# Patient Record
Sex: Female | Born: 1945 | Race: White | Hispanic: No | Marital: Married | Smoking: Never smoker
Health system: Southern US, Community
[De-identification: ages and names within clinical notes are randomized; demographics above are authoritative.]

## PROBLEM LIST (undated history)

## (undated) DIAGNOSIS — J329 Chronic sinusitis, unspecified: Secondary | ICD-10-CM

## (undated) DIAGNOSIS — K219 Gastro-esophageal reflux disease without esophagitis: Secondary | ICD-10-CM

## (undated) DIAGNOSIS — I10 Essential (primary) hypertension: Secondary | ICD-10-CM

## (undated) DIAGNOSIS — Z8619 Personal history of other infectious and parasitic diseases: Secondary | ICD-10-CM

## (undated) DIAGNOSIS — C8590 Non-Hodgkin lymphoma, unspecified, unspecified site: Secondary | ICD-10-CM

## (undated) HISTORY — DX: Personal history of other infectious and parasitic diseases: Z86.19

## (undated) HISTORY — PX: PORTACATH PLACEMENT: SHX2246

## (undated) HISTORY — PX: ESOPHAGOGASTRODUODENOSCOPY: SHX1529

## (undated) HISTORY — PX: COLONOSCOPY: SHX174

## (undated) HISTORY — DX: Chronic sinusitis, unspecified: J32.9

## (undated) HISTORY — PX: OTHER SURGICAL HISTORY: SHX169

## (undated) HISTORY — DX: Essential (primary) hypertension: I10

## (undated) HISTORY — PX: CHOLECYSTECTOMY: SHX55

## (undated) HISTORY — PX: REFRACTIVE SURGERY: SHX103

## (undated) HISTORY — DX: Non-Hodgkin lymphoma, unspecified, unspecified site: C85.90

## (undated) HISTORY — PX: PORT-A-CATH REMOVAL: SHX5289

## (undated) HISTORY — PX: TONSILLECTOMY: SUR1361

## (undated) HISTORY — DX: Gastro-esophageal reflux disease without esophagitis: K21.9

---

## 2002-06-09 ENCOUNTER — Encounter (HOSPITAL_COMMUNITY): Admission: RE | Admit: 2002-06-09 | Discharge: 2002-07-09 | Payer: Self-pay | Admitting: Oncology

## 2002-06-09 ENCOUNTER — Encounter: Admission: RE | Admit: 2002-06-09 | Discharge: 2002-06-09 | Payer: Self-pay | Admitting: Oncology

## 2002-06-24 ENCOUNTER — Encounter (HOSPITAL_COMMUNITY): Payer: Self-pay | Admitting: Oncology

## 2002-07-03 ENCOUNTER — Encounter: Payer: Self-pay | Admitting: General Surgery

## 2002-07-03 ENCOUNTER — Ambulatory Visit (HOSPITAL_COMMUNITY): Admission: RE | Admit: 2002-07-03 | Discharge: 2002-07-03 | Payer: Self-pay | Admitting: General Surgery

## 2002-07-21 ENCOUNTER — Encounter (HOSPITAL_COMMUNITY): Admission: RE | Admit: 2002-07-21 | Discharge: 2002-08-20 | Payer: Self-pay | Admitting: Oncology

## 2002-07-21 ENCOUNTER — Encounter: Admission: RE | Admit: 2002-07-21 | Discharge: 2002-07-21 | Payer: Self-pay | Admitting: Oncology

## 2002-08-28 ENCOUNTER — Encounter (HOSPITAL_COMMUNITY): Admission: RE | Admit: 2002-08-28 | Discharge: 2002-09-27 | Payer: Self-pay | Admitting: Oncology

## 2002-08-28 ENCOUNTER — Encounter: Admission: RE | Admit: 2002-08-28 | Discharge: 2002-08-28 | Payer: Self-pay | Admitting: Oncology

## 2002-09-29 ENCOUNTER — Encounter (HOSPITAL_COMMUNITY): Admission: RE | Admit: 2002-09-29 | Discharge: 2002-10-29 | Payer: Self-pay | Admitting: Oncology

## 2002-09-29 ENCOUNTER — Encounter: Admission: RE | Admit: 2002-09-29 | Discharge: 2002-09-29 | Payer: Self-pay | Admitting: Oncology

## 2002-10-29 ENCOUNTER — Encounter (HOSPITAL_COMMUNITY): Payer: Self-pay | Admitting: Oncology

## 2002-11-11 ENCOUNTER — Encounter: Admission: RE | Admit: 2002-11-11 | Discharge: 2002-11-11 | Payer: Self-pay | Admitting: Oncology

## 2002-11-11 ENCOUNTER — Encounter (HOSPITAL_COMMUNITY): Admission: RE | Admit: 2002-11-11 | Discharge: 2002-12-11 | Payer: Self-pay | Admitting: Oncology

## 2002-11-12 ENCOUNTER — Encounter: Admission: RE | Admit: 2002-11-12 | Discharge: 2002-11-12 | Payer: Self-pay | Admitting: Dentistry

## 2002-11-16 ENCOUNTER — Ambulatory Visit: Admission: RE | Admit: 2002-11-16 | Discharge: 2003-02-14 | Payer: Self-pay | Admitting: *Deleted

## 2002-11-19 ENCOUNTER — Ambulatory Visit (HOSPITAL_COMMUNITY): Admission: RE | Admit: 2002-11-19 | Discharge: 2002-11-19 | Payer: Self-pay | Admitting: General Surgery

## 2002-12-16 ENCOUNTER — Encounter (HOSPITAL_COMMUNITY): Admission: RE | Admit: 2002-12-16 | Discharge: 2003-01-15 | Payer: Self-pay | Admitting: Oncology

## 2002-12-16 ENCOUNTER — Encounter: Admission: RE | Admit: 2002-12-16 | Discharge: 2002-12-16 | Payer: Self-pay | Admitting: Oncology

## 2003-02-19 ENCOUNTER — Encounter: Admission: RE | Admit: 2003-02-19 | Discharge: 2003-02-19 | Payer: Self-pay | Admitting: Oncology

## 2003-02-19 ENCOUNTER — Encounter (HOSPITAL_COMMUNITY): Admission: RE | Admit: 2003-02-19 | Discharge: 2003-03-21 | Payer: Self-pay | Admitting: Oncology

## 2003-04-09 ENCOUNTER — Encounter: Admission: RE | Admit: 2003-04-09 | Discharge: 2003-04-09 | Payer: Self-pay | Admitting: Oncology

## 2003-08-20 ENCOUNTER — Encounter: Admission: RE | Admit: 2003-08-20 | Discharge: 2003-08-20 | Payer: Self-pay | Admitting: Oncology

## 2003-08-20 ENCOUNTER — Encounter (HOSPITAL_COMMUNITY): Admission: RE | Admit: 2003-08-20 | Discharge: 2003-09-19 | Payer: Self-pay | Admitting: Oncology

## 2003-10-25 ENCOUNTER — Ambulatory Visit (HOSPITAL_COMMUNITY): Admission: RE | Admit: 2003-10-25 | Discharge: 2003-10-25 | Payer: Self-pay | Admitting: Oncology

## 2004-03-07 ENCOUNTER — Encounter (HOSPITAL_COMMUNITY): Admission: RE | Admit: 2004-03-07 | Discharge: 2004-03-17 | Payer: Self-pay | Admitting: Oncology

## 2004-03-07 ENCOUNTER — Encounter: Admission: RE | Admit: 2004-03-07 | Discharge: 2004-03-17 | Payer: Self-pay | Admitting: Oncology

## 2004-09-04 ENCOUNTER — Encounter: Admission: RE | Admit: 2004-09-04 | Discharge: 2004-09-04 | Payer: Self-pay | Admitting: Oncology

## 2004-09-04 ENCOUNTER — Ambulatory Visit (HOSPITAL_COMMUNITY): Payer: Self-pay | Admitting: Oncology

## 2004-09-04 ENCOUNTER — Encounter (HOSPITAL_COMMUNITY): Admission: RE | Admit: 2004-09-04 | Discharge: 2004-10-04 | Payer: Self-pay | Admitting: Oncology

## 2004-10-30 ENCOUNTER — Ambulatory Visit (HOSPITAL_COMMUNITY): Admission: RE | Admit: 2004-10-30 | Discharge: 2004-10-30 | Payer: Self-pay | Admitting: Oncology

## 2005-03-05 ENCOUNTER — Encounter: Admission: RE | Admit: 2005-03-05 | Discharge: 2005-03-17 | Payer: Self-pay | Admitting: Oncology

## 2005-03-05 ENCOUNTER — Encounter (HOSPITAL_COMMUNITY): Admission: RE | Admit: 2005-03-05 | Discharge: 2005-03-17 | Payer: Self-pay | Admitting: Oncology

## 2005-03-05 ENCOUNTER — Ambulatory Visit (HOSPITAL_COMMUNITY): Payer: Self-pay | Admitting: Oncology

## 2005-09-03 ENCOUNTER — Encounter (HOSPITAL_COMMUNITY): Admission: RE | Admit: 2005-09-03 | Discharge: 2005-10-03 | Payer: Self-pay | Admitting: Oncology

## 2005-09-03 ENCOUNTER — Encounter: Admission: RE | Admit: 2005-09-03 | Discharge: 2005-09-03 | Payer: Self-pay | Admitting: Oncology

## 2005-09-03 ENCOUNTER — Ambulatory Visit (HOSPITAL_COMMUNITY): Payer: Self-pay | Admitting: Oncology

## 2005-11-05 ENCOUNTER — Ambulatory Visit (HOSPITAL_COMMUNITY): Admission: RE | Admit: 2005-11-05 | Discharge: 2005-11-05 | Payer: Self-pay | Admitting: Oncology

## 2006-03-04 ENCOUNTER — Encounter (HOSPITAL_COMMUNITY): Admission: RE | Admit: 2006-03-04 | Discharge: 2006-03-15 | Payer: Self-pay | Admitting: Oncology

## 2006-03-04 ENCOUNTER — Ambulatory Visit (HOSPITAL_COMMUNITY): Payer: Self-pay | Admitting: Oncology

## 2006-03-04 ENCOUNTER — Encounter: Admission: RE | Admit: 2006-03-04 | Discharge: 2006-03-15 | Payer: Self-pay | Admitting: Oncology

## 2006-09-17 ENCOUNTER — Encounter (HOSPITAL_COMMUNITY): Admission: RE | Admit: 2006-09-17 | Discharge: 2006-10-17 | Payer: Self-pay | Admitting: Oncology

## 2006-09-17 ENCOUNTER — Ambulatory Visit (HOSPITAL_COMMUNITY): Payer: Self-pay | Admitting: Oncology

## 2006-11-05 ENCOUNTER — Ambulatory Visit (HOSPITAL_COMMUNITY): Admission: RE | Admit: 2006-11-05 | Discharge: 2006-11-05 | Payer: Self-pay | Admitting: Oncology

## 2007-04-01 ENCOUNTER — Ambulatory Visit (HOSPITAL_COMMUNITY): Payer: Self-pay | Admitting: Oncology

## 2007-04-01 ENCOUNTER — Encounter (HOSPITAL_COMMUNITY): Admission: RE | Admit: 2007-04-01 | Discharge: 2007-05-01 | Payer: Self-pay | Admitting: Oncology

## 2007-05-06 ENCOUNTER — Ambulatory Visit (HOSPITAL_COMMUNITY): Admission: RE | Admit: 2007-05-06 | Discharge: 2007-05-06 | Payer: Self-pay | Admitting: Otolaryngology

## 2007-07-11 ENCOUNTER — Ambulatory Visit (HOSPITAL_COMMUNITY): Admission: RE | Admit: 2007-07-11 | Discharge: 2007-07-11 | Payer: Self-pay | Admitting: Otolaryngology

## 2007-10-07 ENCOUNTER — Encounter (HOSPITAL_COMMUNITY): Admission: RE | Admit: 2007-10-07 | Discharge: 2007-11-06 | Payer: Self-pay | Admitting: Oncology

## 2007-10-07 ENCOUNTER — Ambulatory Visit (HOSPITAL_COMMUNITY): Payer: Self-pay | Admitting: Oncology

## 2007-11-11 ENCOUNTER — Ambulatory Visit (HOSPITAL_COMMUNITY): Admission: RE | Admit: 2007-11-11 | Discharge: 2007-11-11 | Payer: Self-pay | Admitting: Oncology

## 2008-04-05 ENCOUNTER — Ambulatory Visit (HOSPITAL_COMMUNITY): Payer: Self-pay | Admitting: Oncology

## 2008-04-05 ENCOUNTER — Encounter (HOSPITAL_COMMUNITY): Admission: RE | Admit: 2008-04-05 | Discharge: 2008-05-05 | Payer: Self-pay | Admitting: Oncology

## 2008-10-04 ENCOUNTER — Ambulatory Visit (HOSPITAL_COMMUNITY): Payer: Self-pay | Admitting: Oncology

## 2008-10-04 ENCOUNTER — Encounter (HOSPITAL_COMMUNITY): Admission: RE | Admit: 2008-10-04 | Discharge: 2008-11-03 | Payer: Self-pay | Admitting: Oncology

## 2008-11-16 ENCOUNTER — Ambulatory Visit (HOSPITAL_COMMUNITY): Admission: RE | Admit: 2008-11-16 | Discharge: 2008-11-16 | Payer: Self-pay | Admitting: Oncology

## 2009-04-04 ENCOUNTER — Ambulatory Visit (HOSPITAL_COMMUNITY): Payer: Self-pay | Admitting: Oncology

## 2009-04-04 ENCOUNTER — Encounter (HOSPITAL_COMMUNITY): Admission: RE | Admit: 2009-04-04 | Discharge: 2009-05-04 | Payer: Self-pay | Admitting: Oncology

## 2009-11-17 ENCOUNTER — Ambulatory Visit (HOSPITAL_COMMUNITY): Admission: RE | Admit: 2009-11-17 | Discharge: 2009-11-17 | Payer: Self-pay | Admitting: Oncology

## 2009-11-21 ENCOUNTER — Encounter (HOSPITAL_COMMUNITY): Admission: RE | Admit: 2009-11-21 | Discharge: 2009-12-21 | Payer: Self-pay | Admitting: Oncology

## 2010-05-30 ENCOUNTER — Encounter (HOSPITAL_COMMUNITY)
Admission: RE | Admit: 2010-05-30 | Discharge: 2010-06-29 | Payer: Self-pay | Source: Home / Self Care | Attending: Oncology | Admitting: Oncology

## 2010-05-30 ENCOUNTER — Ambulatory Visit (HOSPITAL_COMMUNITY): Payer: Self-pay | Admitting: Oncology

## 2010-07-10 ENCOUNTER — Encounter (HOSPITAL_COMMUNITY): Payer: Self-pay | Admitting: Oncology

## 2010-08-28 LAB — DIFFERENTIAL
Basophils Absolute: 0 10*3/uL (ref 0.0–0.1)
Lymphocytes Relative: 26 % (ref 12–46)
Lymphs Abs: 1.2 10*3/uL (ref 0.7–4.0)
Monocytes Relative: 8 % (ref 3–12)
Neutrophils Relative %: 64 % (ref 43–77)

## 2010-08-28 LAB — COMPREHENSIVE METABOLIC PANEL
AST: 22 U/L (ref 0–37)
Alkaline Phosphatase: 74 U/L (ref 39–117)
CO2: 32 mEq/L (ref 19–32)
Calcium: 10 mg/dL (ref 8.4–10.5)
Chloride: 95 mEq/L — ABNORMAL LOW (ref 96–112)
GFR calc Af Amer: >60 mL/min (ref >60)
GFR calc non Af Amer: >60 mL/min (ref >60)
Glucose, Bld: 113 mg/dL — ABNORMAL HIGH (ref 70–99)
Sodium: 134 mEq/L — ABNORMAL LOW (ref 135–145)

## 2010-08-28 LAB — CBC
Hemoglobin: 12.7 g/dL (ref 12.0–15.0)
RBC: 4.01 MIL/uL (ref 3.87–5.11)
WBC: 4.5 10*3/uL (ref 4.0–10.5)

## 2010-09-04 LAB — DIFFERENTIAL
Eosinophils Absolute: 0.1 10*3/uL (ref 0.0–0.7)
Lymphs Abs: 1.2 10*3/uL (ref 0.7–4.0)
Monocytes Absolute: 0.4 10*3/uL (ref 0.1–1.0)
Monocytes Relative: 9 % (ref 3–12)
Neutro Abs: 2.7 10*3/uL (ref 1.7–7.7)

## 2010-09-04 LAB — GLUCOSE, CAPILLARY: Glucose-Capillary: 172 mg/dL — ABNORMAL HIGH (ref 70–99)

## 2010-09-04 LAB — CBC
HCT: 35.6 % — ABNORMAL LOW (ref 36.0–46.0)
Hemoglobin: 12.6 g/dL (ref 12.0–15.0)
MCHC: 35.6 g/dL (ref 30.0–36.0)
MCV: 91.9 fL (ref 78.0–100.0)
RBC: 3.87 MIL/uL (ref 3.87–5.11)

## 2010-09-04 LAB — COMPREHENSIVE METABOLIC PANEL
ALT: 21 U/L (ref 0–35)
AST: 21 U/L (ref 0–37)
Albumin: 4.3 g/dL (ref 3.5–5.2)
Alkaline Phosphatase: 79 U/L (ref 39–117)
BUN: 16 mg/dL (ref 6–23)
CO2: 32 mEq/L (ref 19–32)
Calcium: 10 mg/dL (ref 8.4–10.5)
Chloride: 95 mEq/L — ABNORMAL LOW (ref 96–112)
Creatinine, Ser: 0.77 mg/dL (ref 0.4–1.2)
GFR calc Af Amer: >60 mL/min (ref >60)
GFR calc non Af Amer: >60 mL/min (ref >60)
Glucose, Bld: 124 mg/dL — ABNORMAL HIGH (ref 70–99)
Potassium: 3.9 mEq/L (ref 3.5–5.1)
Sodium: 136 mEq/L (ref 135–145)
Total Bilirubin: 0.7 mg/dL (ref 0.3–1.2)
Total Protein: 6.5 g/dL (ref 6.0–8.3)

## 2010-09-04 LAB — LACTATE DEHYDROGENASE: LDH: 146 U/L (ref 94–250)

## 2010-09-21 LAB — COMPREHENSIVE METABOLIC PANEL
ALT: 21 U/L (ref 0–35)
AST: 19 U/L (ref 0–37)
Alkaline Phosphatase: 65 U/L (ref 39–117)
BUN: 16 mg/dL (ref 6–23)
Calcium: 9.5 mg/dL (ref 8.4–10.5)
Creatinine, Ser: 0.77 mg/dL (ref 0.4–1.2)
GFR calc non Af Amer: >60 mL/min (ref >60)
Potassium: 3.8 mEq/L (ref 3.5–5.1)
Total Protein: 6.5 g/dL (ref 6.0–8.3)

## 2010-09-21 LAB — CBC
HCT: 35.8 % — ABNORMAL LOW (ref 36.0–46.0)
Hemoglobin: 12.6 g/dL (ref 12.0–15.0)
MCHC: 35.2 g/dL (ref 30.0–36.0)
Platelets: 147 10*3/uL — ABNORMAL LOW (ref 150–400)
RDW: 12.6 % (ref 11.5–15.5)
WBC: 3.5 10*3/uL — ABNORMAL LOW (ref 4.0–10.5)

## 2010-09-21 LAB — DIFFERENTIAL
Basophils Absolute: 0 10*3/uL (ref 0.0–0.1)
Eosinophils Relative: 2 % (ref 0–5)
Monocytes Absolute: 0.3 10*3/uL (ref 0.1–1.0)

## 2010-09-21 LAB — SEDIMENTATION RATE: Sed Rate: 5 mm/hr (ref 0–22)

## 2010-09-25 LAB — GLUCOSE, CAPILLARY: Glucose-Capillary: 170 mg/dL — ABNORMAL HIGH (ref 70–99)

## 2010-09-27 LAB — COMPREHENSIVE METABOLIC PANEL
ALT: 18 U/L (ref 0–35)
AST: 20 U/L (ref 0–37)
Albumin: 4 g/dL (ref 3.5–5.2)
Alkaline Phosphatase: 84 U/L (ref 39–117)
BUN: 16 mg/dL (ref 6–23)
CO2: 32 mEq/L (ref 19–32)
Calcium: 9.7 mg/dL (ref 8.4–10.5)
Chloride: 96 mEq/L (ref 96–112)
Creatinine, Ser: 0.8 mg/dL (ref 0.4–1.2)
GFR calc Af Amer: >60 mL/min (ref >60)
GFR calc non Af Amer: >60 mL/min (ref >60)
Glucose, Bld: 113 mg/dL — ABNORMAL HIGH (ref 70–99)
Potassium: 4.5 mEq/L (ref 3.5–5.1)
Sodium: 136 mEq/L (ref 135–145)
Total Bilirubin: 0.6 mg/dL (ref 0.3–1.2)
Total Protein: 6.5 g/dL (ref 6.0–8.3)

## 2010-09-27 LAB — DIFFERENTIAL
Eosinophils Absolute: 0.1 10*3/uL (ref 0.0–0.7)
Eosinophils Relative: 2 % (ref 0–5)
Lymphocytes Relative: 23 % (ref 12–46)
Lymphs Abs: 0.9 10*3/uL (ref 0.7–4.0)
Monocytes Absolute: 0.3 10*3/uL (ref 0.1–1.0)
Monocytes Relative: 6 % (ref 3–12)

## 2010-09-27 LAB — CBC
MCHC: 36 g/dL (ref 30.0–36.0)
Platelets: 162 10*3/uL (ref 150–400)
RBC: 3.91 MIL/uL (ref 3.87–5.11)
WBC: 4.1 10*3/uL (ref 4.0–10.5)

## 2010-11-03 NOTE — H&P (Signed)
   [name] [name], [name] [name]                           ACCOUNT NO.:  [id number]   MEDICAL RECORD NO.:  [id number]                   PATIENT TYPE:  AMB   LOCATION:  DAY                                  FACILITY:  [facility]   PHYSICIAN:  Dalia Heading, M.D.               DATE OF BIRTH:  1946/06/14   DATE OF ADMISSION:  07/03/2002  DATE OF DISCHARGE:  07/03/2002                                HISTORY & PHYSICAL   CHIEF COMPLAINT:  Lymphoma, finished chemotherapy.   HISTORY OF PRESENT ILLNESS:  The patient is a 65 year old white female who  was referred for removal of a Port-A-Cath.  She was treated for lymphoma and  has finished her chemotherapy.   PAST MEDICAL HISTORY:  1. Hypertension.  2. Non-insulin-dependent diabetes mellitus.   PAST SURGICAL HISTORY:  1. Cholecystectomy.  2. Tonsillectomy.  3. Neck lymph node biopsy in 2003.  4. Port-A-Cath insertion in January 2004.   CURRENT MEDICATIONS:  1. Amaryl.  2. Hydrochlorothiazide.  3. Claritin as needed.   ALLERGIES:  1. SULFA.  2. CODEINE.   REVIEW OF SYSTEMS:  Noncontributory.   PHYSICAL EXAMINATION:  GENERAL:  Patient is a well-developed, well-nourished  white female in no acute distress.  VITAL SIGNS:  She is afebrile and vital signs are stable.  LUNGS:  Clear to auscultation with equal breath sounds bilaterally.  HEART:  Reveals a regular, rate and rhythm without S3, S4, murmurs.  A Port-  A-Cath is in place in the left upper chest.   IMPRESSION:  Lymphoma, finished with chemotherapy.    PLAN:  The patient was scheduled for Port-A-Cath removal on November 19, 2002.  The risks and benefits of the procedure including bleeding and infection  were fully explained to the patient who gave informed consent.                                                 Dalia Heading, M.D.    MAJ/MEDQ  D:  11/17/2002  T:  11/17/2002  Job:  [id number]   cc:   Ladona Horns. Neijstrom, MD  [address]  [place]  [place] [address]  Fax:  [phone]

## 2010-11-03 NOTE — Op Note (Signed)
   NAME:  [name] [name], [name] [name]                           ACCOUNT NO.:  [id number]   MEDICAL RECORD NO.:  [id number]                   PATIENT TYPE:  AMB   LOCATION:  DAY                                  FACILITY:  [facility]   PHYSICIAN:  Dalia Heading, M.D.               DATE OF BIRTH:  [birth year]   DATE OF PROCEDURE:  11/19/2002  DATE OF DISCHARGE:                                 OPERATIVE REPORT   PREOPERATIVE DIAGNOSIS:  Lymphoma, finished with chemotherapy.   POSTOPERATIVE DIAGNOSIS:  Lymphoma, finished with chemotherapy.   PROCEDURE:  Port-A-Cath removal.   SURGEON:  Dalia Heading, M.D.   ANESTHESIA:  Local.   INDICATIONS:  The patient is a 65 year old white female who presents for  removal of her Port-A-Cath.  She has finished her chemotherapy for lymphoma.  The risks and benefits of the procedure were fully explained to the patient,  who gave informed consent.   PROCEDURE NOTE:  The patient was placed in the supine position.  The left  upper chest was prepped and draped using the usual sterile technique with  Betadine.  Surgical site confirmation was performed.  Xylocaine 1% was used  for local anesthesia.   A transverse incision was made through the previous surgical incision site.  This was taken down to the port and the port was removed without difficulty.  No abnormal bleeding was noted at the end of the procedure.  The  subcutaneous layer was reapproximated using a 3-0 Vicryl interrupted suture.  The skin was closed using a 4-0 Vicryl subcuticular suture.  Steri-Strips  and a dry sterile dressing were applied.   All tape and needle counts were correct at the end of the procedure.  The  patient was transferred back to day surgery in stable condition.   COMPLICATIONS:  None.    SPECIMENS:  None.   ESTIMATED BLOOD LOSS:  Minimal.                                               Dalia Heading, M.D.    MAJ/MEDQ  D:  11/19/2002  T:  11/19/2002  Job:  [id number]   cc:    Ladona Horns. Neijstrom, MD  [address]  [place]  [place] [address]  Fax: [phone]

## 2010-11-03 NOTE — Procedures (Signed)
   [name], [name]                           ACCOUNT NO.:  [id number]   MEDICAL RECORD NO.:  [id number]                   PATIENT TYPE:  AMB   LOCATION:  DAY                                  FACILITY:  [facility]   PHYSICIAN:  Vida Roller, M.D.                DATE OF BIRTH:  06-23-1945   DATE OF PROCEDURE:  08/28/2002  DATE OF DISCHARGE:  07/03/2002                                  ECHOCARDIOGRAM   REFERRING PHYSICIAN:  Dr. Mariel Sleet   Tape #[id number], tape count [id number].   HISTORY:  This is a patient with lymphoma status post chemotherapy for  assessment of LV function.   The technical quality of the study is adequate.   M-MODE MEASUREMENTS:  The aorta is 26 mm.   The left atrium is 40 mm.   The septum is 11 mm.   The posterior wall is 11 mm.   Diastolic dimension is 45 mm.   Systolic dimension is 25 mm.   2-D AND DOPPLER IMAGING:  The left ventricle is normal size with normal  systolic function.  There are no wall motion abnormalities seen.   Diastolic function was not adequately assessed.   The right ventricle is normal size with normal systolic function.   Both atria are normal size.   The aortic valve is mildly sclerotic with evidence of trace aortic  insufficiency.  No stenosis is seen.   The mitral valve is morphologically unremarkable with trivial mitral  regurgitation, no stenosis is seen.   The tricuspid valve shows trace insufficiency.  No stenosis is seen.   The pulmonic valve was not well seen.   The ascending aorta was not well seen.   The pericardial structures appear normal.                                               Vida Roller, M.D.    JH/MEDQ  D:  08/28/2002  T:  08/29/2002  Job:  [id number]

## 2010-11-14 ENCOUNTER — Ambulatory Visit (HOSPITAL_COMMUNITY): Payer: Self-pay | Admitting: Oncology

## 2010-11-15 ENCOUNTER — Other Ambulatory Visit (HOSPITAL_COMMUNITY): Payer: Self-pay | Admitting: Oncology

## 2010-11-15 ENCOUNTER — Encounter (HOSPITAL_COMMUNITY): Payer: Medicare Other | Attending: Oncology | Admitting: Oncology

## 2010-11-15 DIAGNOSIS — Z9221 Personal history of antineoplastic chemotherapy: Secondary | ICD-10-CM | POA: Insufficient documentation

## 2010-11-15 DIAGNOSIS — Z923 Personal history of irradiation: Secondary | ICD-10-CM | POA: Insufficient documentation

## 2010-11-15 DIAGNOSIS — C8589 Other specified types of non-Hodgkin lymphoma, extranodal and solid organ sites: Secondary | ICD-10-CM | POA: Insufficient documentation

## 2010-11-15 DIAGNOSIS — K589 Irritable bowel syndrome without diarrhea: Secondary | ICD-10-CM | POA: Insufficient documentation

## 2010-11-15 DIAGNOSIS — E119 Type 2 diabetes mellitus without complications: Secondary | ICD-10-CM | POA: Insufficient documentation

## 2010-11-15 LAB — COMPREHENSIVE METABOLIC PANEL
ALT: 17 U/L (ref 0–35)
Alkaline Phosphatase: 96 U/L (ref 39–117)
BUN: 23 mg/dL (ref 6–23)
CO2: 29 mEq/L (ref 19–32)
Calcium: 10.3 mg/dL (ref 8.4–10.5)
GFR calc non Af Amer: >60 mL/min (ref >60)
Glucose, Bld: 165 mg/dL — ABNORMAL HIGH (ref 70–99)
Sodium: 131 mEq/L — ABNORMAL LOW (ref 135–145)
Total Protein: 7 g/dL (ref 6.0–8.3)

## 2010-11-15 LAB — DIFFERENTIAL
Basophils Absolute: 0 10*3/uL (ref 0.0–0.1)
Eosinophils Relative: 2 % (ref 0–5)
Lymphocytes Relative: 27 % (ref 12–46)
Lymphs Abs: 1.4 10*3/uL (ref 0.7–4.0)
Monocytes Absolute: 0.3 10*3/uL (ref 0.1–1.0)
Monocytes Relative: 6 % (ref 3–12)
Neutro Abs: 3.3 10*3/uL (ref 1.7–7.7)

## 2010-11-15 LAB — SEDIMENTATION RATE: Sed Rate: 9 mm/hr (ref 0–22)

## 2010-11-15 LAB — CBC
HCT: 35 % — ABNORMAL LOW (ref 36.0–46.0)
Hemoglobin: 12.3 g/dL (ref 12.0–15.0)
MCHC: 35.1 g/dL (ref 30.0–36.0)
MCV: 89.3 fL (ref 78.0–100.0)
RDW: 11.9 % (ref 11.5–15.5)

## 2010-11-15 LAB — LACTATE DEHYDROGENASE: LDH: 213 U/L (ref 94–250)

## 2011-02-27 IMAGING — PT NM PET TUM IMG RESTAG (PS) SKULL BASE T - THIGH
1 of 6 series · 1 of 25 positions shown · non-contrast
Comparison: 11/16/2008

CLINICAL DATA: Subsequent treatment strategy for lymphoma.
Chemotherapy last treatment 6445.

NUCLEAR MEDICINE PET CT SKULL BASE TO THIGH
TECHNIQUE: 18.9 mCi F-18 FDG was injected intravenously via the
right AC.  Full-ring PET imaging was performed from the skull base
through the mid-thighs 58  minutes after injection.  CT data was
obtained and used for attenuation correction and anatomic
localization only.  (This was not acquired as a diagnostic CT
examination.)
Fasting Blood Glucose:  172

[Series 2: ct images · axial · 3.8mm · 0.98mm/px · 1 of 266 slices shown]
[im 266/266  brain]
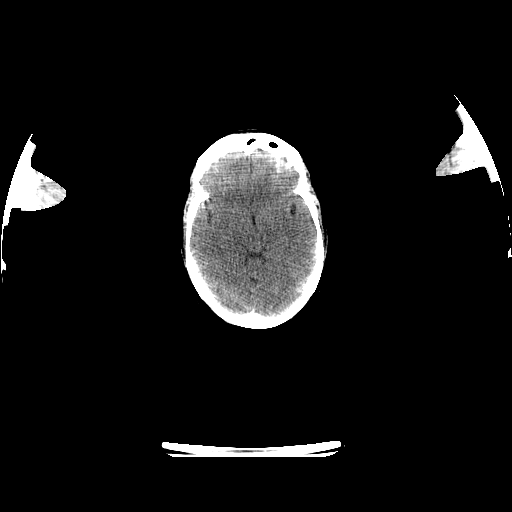

[1 of 25 positions shown; findings below may reference images not displayed]

FINDINGS: PET images demonstrate no abnormal activity within the
neck, chest, abdomen, or pelvis to suggest recurrent lymphoma.
Activity about the greater trochanters of the femurs is consistent
with bursitis bilaterally.

CT images performed for attenuation correction demonstrate mucous
retention cyst or polyps within the right maxillary sinus .  No
cervical adenopathy.  Relative atrophy of the left lobe of the
thyroid.  Mild cardiomegaly.  6 mm left lower lobe lung nodule is
unchanged on image 93.  Cholecystectomy.
IMPRESSION: 1.  No evidence of hypermetabolic recurrent or residual lymphoma.
2.  Stable left lower lobe lung nodule since 10/25/2003.
Consistent with benignity.

## 2011-03-13 LAB — COMPREHENSIVE METABOLIC PANEL
ALT: 20
AST: 18
Alkaline Phosphatase: 79
Calcium: 9.6
GFR calc Af Amer: >60
Potassium: 4.1
Sodium: 131 — ABNORMAL LOW
Total Protein: 6.7

## 2011-03-13 LAB — CBC
MCHC: 35.9
RBC: 4.05
RDW: 13.6

## 2011-03-13 LAB — DIFFERENTIAL
Basophils Relative: 1
Eosinophils Absolute: 0.1
Eosinophils Relative: 2
Lymphs Abs: 1.2
Monocytes Absolute: 0.3
Monocytes Relative: 7

## 2011-03-19 LAB — DIFFERENTIAL
Basophils Absolute: 0
Basophils Relative: 0
Lymphocytes Relative: 24
Monocytes Relative: 6
Neutro Abs: 2.6
Neutrophils Relative %: 68

## 2011-03-19 LAB — COMPREHENSIVE METABOLIC PANEL
Alkaline Phosphatase: 75
BUN: 13
Creatinine, Ser: 0.68
Glucose, Bld: 180 — ABNORMAL HIGH
Potassium: 4.1
Total Protein: 6.5

## 2011-03-19 LAB — CBC
HCT: 37.7
Hemoglobin: 13.3
MCHC: 35.2
MCV: 92.6
RDW: 12.2

## 2011-03-19 LAB — LACTATE DEHYDROGENASE: LDH: 119

## 2011-03-29 LAB — COMPREHENSIVE METABOLIC PANEL
AST: 23
Albumin: 4.1
Alkaline Phosphatase: 81
Chloride: 95 — ABNORMAL LOW
GFR calc Af Amer: >60
Potassium: 4.1
Total Bilirubin: 0.8

## 2011-03-29 LAB — CBC
HCT: 39.8
Platelets: 171
WBC: 4.9

## 2011-03-29 LAB — DIFFERENTIAL
Basophils Absolute: 0
Basophils Relative: 1
Eosinophils Relative: 2
Monocytes Absolute: 0.4

## 2011-07-16 ENCOUNTER — Encounter (HOSPITAL_COMMUNITY): Payer: Medicare Other | Attending: Oncology | Admitting: Oncology

## 2011-07-16 ENCOUNTER — Telehealth (HOSPITAL_COMMUNITY): Payer: Self-pay | Admitting: *Deleted

## 2011-07-16 ENCOUNTER — Encounter (HOSPITAL_COMMUNITY): Payer: Self-pay | Admitting: Oncology

## 2011-07-16 VITALS — BP 168/82 | HR 70 | Temp 98.1°F | Ht 62.0 in | Wt 165.2 lb

## 2011-07-16 DIAGNOSIS — C8590 Non-Hodgkin lymphoma, unspecified, unspecified site: Secondary | ICD-10-CM

## 2011-07-16 DIAGNOSIS — C8589 Other specified types of non-Hodgkin lymphoma, extranodal and solid organ sites: Secondary | ICD-10-CM

## 2011-07-16 DIAGNOSIS — E119 Type 2 diabetes mellitus without complications: Secondary | ICD-10-CM

## 2011-07-16 LAB — DIFFERENTIAL
Basophils Absolute: 0 10*3/uL (ref 0.0–0.1)
Lymphocytes Relative: 24 % (ref 12–46)
Lymphs Abs: 1.3 10*3/uL (ref 0.7–4.0)
Neutrophils Relative %: 67 % (ref 43–77)

## 2011-07-16 LAB — CBC
Platelets: 177 10*3/uL (ref 150–400)
RBC: 4.16 MIL/uL (ref 3.87–5.11)
RDW: 12 % (ref 11.5–15.5)
WBC: 5.3 10*3/uL (ref 4.0–10.5)

## 2011-07-16 LAB — LACTATE DEHYDROGENASE: LDH: 157 U/L (ref 94–250)

## 2011-07-16 NOTE — Progress Notes (Signed)
CC:   [name] Cordial, MD [name] [name], M.D.  DIAGNOSES: 1. 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 Jackelyn [name].  She received 6     cycles of R-CHOP.  She has remained disease-free since.  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 2. Adult-onset diabetes mellitus on oral therapy. 3. Mild obesity. 4. Esophagogastroduodenoscopy and colonoscopy in 2005 with colonoscopy     again in 2009. 5. Laser eye surgery 2002. 6. Tonsillectomy 1973. 7. Chronic sinus infections in the past. 8. Ganglion cyst of the left wrist.  [name] [name] had blood work that she brought with her which is excellent except for mildly elevated hemoglobin A1c.  She is on cholesterol medication and her cholesterol levels were quite good.  She takes simvastatin 20 mg once a day.  She had a sugar that was also elevated greater than 160 and that goes along with her hemoglobin A1c being elevated.  She fluctuates, she states.  She takes her metformin 500 mg b.i.d., she states.  She takes Amaryl 4 mg once a day.  She also gets some exercise.  She walks about a mile a day.  She is asymptomatic from any lymphoma standpoint.  I have not repeated any recent PET scans on her and do not intend to unless she is symptomatic.  PHYSICAL EXAM:  General:  She looks fantastic.  Vital Signs:  All very stable.  Her weight is still too much for her height of 5 feet 2 inches. She weighs 165 pounds today.  Her BMI is 30.3.  Blood pressure slightly high at 168/82, left arm sitting position, but she is mildly anxious. Lymphatic:  She has no lymphadenopathy that is palpable in the cervical, supraclavicular, infraclavicular, axillary, or inguinal  areas.  Abdomen: She has a little fullness in the upper quadrants of the abdomen but no distinct hepatosplenomegaly.  Bowel sounds are normal.  Lungs:  Clear. Heart:  Regular rhythm and rate without murmur rub or gallop.  Breast Exam:  Negative for masses.  Extremities:  She has no peripheral edema of the arms or legs.  PLAN:  We will see her in 8 more months this time.  I will get a CBC, diff, and an LDH today.  But, I do think we need to see her at least once a year should this low-grade lymphoma ever manifest itself.  I do not think we need to re-scan, however, unless she is symptomatic or unless her laboratory values changed.  She is happy with this plan.  She looks great today. We will see her as mentioned.   ______________________________ [name] [name]. Mariel Sleet, MD ESN/MEDQ  D:  07/16/2011  T:  07/16/2011  Job:  [id number]

## 2011-07-16 NOTE — Telephone Encounter (Signed)
Message copied by Dennie Maizes on Mon Jul 16, 2011  4:58 PM ------      Message from: Mariel Sleet, ERIC S      Created: Mon Jul 16, 2011  1:36 PM       Cal her -labs perfect.

## 2011-07-16 NOTE — Progress Notes (Signed)
This office note has been dictated.

## 2011-07-16 NOTE — Telephone Encounter (Signed)
Spoke with pt as below.

## 2011-07-16 NOTE — Patient Instructions (Signed)
[name] [name]  [id number] 09/07/1945   [facility]  Discharge Instructions  RECOMMENDATIONS MADE BY THE CONSULTANT AND ANY TEST RESULTS WILL BE SENT TO YOUR REFERRING DOCTOR.   EXAM FINDINGS BY MD TODAY AND SIGNS AND SYMPTOMS TO REPORT TO CLINIC OR PRIMARY MD: you are doing well.  Will check labs today and if any problems we will call you.  Report any new lumps, infections,sweats, etc.   MEDICATIONS PRESCRIBED: none   SPECIAL INSTRUCTIONS/FOLLOW-UP: Return to Clinic in 8 months.   I acknowledge that I have been informed and understand all the instructions given to me and received a copy. I do not have any more questions at this time, but understand that I may call the Specialty Clinic at [facility] at [phone] during business hours should I have any further questions or need assistance in obtaining follow-up care.    __________________________________________  _____________  __________ Signature of Patient or Authorized Representative            Date                   Time    __________________________________________ Nurse's Signature

## 2012-03-17 ENCOUNTER — Encounter (HOSPITAL_COMMUNITY): Payer: Self-pay

## 2012-03-17 ENCOUNTER — Encounter (HOSPITAL_COMMUNITY): Payer: Medicare Other | Attending: Oncology | Admitting: Hematology and Oncology

## 2012-03-17 VITALS — BP 152/72 | HR 72 | Temp 97.8°F | Resp 16 | Wt 159.0 lb

## 2012-03-17 DIAGNOSIS — C8590 Non-Hodgkin lymphoma, unspecified, unspecified site: Secondary | ICD-10-CM

## 2012-03-17 DIAGNOSIS — C8589 Other specified types of non-Hodgkin lymphoma, extranodal and solid organ sites: Secondary | ICD-10-CM | POA: Insufficient documentation

## 2012-03-17 DIAGNOSIS — Z23 Encounter for immunization: Secondary | ICD-10-CM

## 2012-03-17 LAB — CBC WITH DIFFERENTIAL/PLATELET
Basophils Absolute: 0 10*3/uL (ref 0.0–0.1)
Basophils Relative: 1 % (ref 0–1)
Eosinophils Absolute: 0.1 10*3/uL (ref 0.0–0.7)
Eosinophils Relative: 2 % (ref 0–5)
HCT: 35 % — ABNORMAL LOW (ref 36.0–46.0)
Hemoglobin: 12.1 g/dL (ref 12.0–15.0)
MCH: 31.1 pg (ref 26.0–34.0)
MCHC: 34.6 g/dL (ref 30.0–36.0)
Monocytes Absolute: 0.3 10*3/uL (ref 0.1–1.0)
Monocytes Relative: 7 % (ref 3–12)
RDW: 12.2 % (ref 11.5–15.5)

## 2012-03-17 LAB — COMPREHENSIVE METABOLIC PANEL
AST: 17 U/L (ref 0–37)
Albumin: 3.9 g/dL (ref 3.5–5.2)
Alkaline Phosphatase: 79 U/L (ref 39–117)
BUN: 14 mg/dL (ref 6–23)
CO2: 29 mEq/L (ref 19–32)
Chloride: 97 mEq/L (ref 96–112)
GFR calc non Af Amer: 86 mL/min — ABNORMAL LOW (ref >90)
Potassium: 4 mEq/L (ref 3.5–5.1)
Total Bilirubin: 0.4 mg/dL (ref 0.3–1.2)

## 2012-03-17 MED ORDER — PNEUMOCOCCAL VAC POLYVALENT 25 MCG/0.5ML IJ INJ
0.5000 mL | INJECTION | Freq: Once | INTRAMUSCULAR | Status: AC
  Administered 2012-03-17: 0.5 mL via INTRAMUSCULAR
  Filled 2012-03-17: qty 0.5

## 2012-03-17 NOTE — Progress Notes (Signed)
This office note has been dictated.

## 2012-03-17 NOTE — Patient Instructions (Addendum)
[name] [name]  [id number]   [facility] - AFTER VISIT SUMMARY   **RECOMMENDATIONS MADE BY THE CONSULTANT AND ANY TEST    RESULTS WILL BE SENT TO YOUR REFERRING DOCTORS.   YOUR EXAM FINDINGS, LABS AND RESULTS WERE DISCUSSED BY YOUR MD TODAY.  YOU CAN GO TO THE [facility] WEB SITE FOR INSTRUCTIONS ON HOW TO ASSESS MY CHART FOR ADDITIONAL INFORMATION AS NEEDED.  Your Updated drug allergies are: Allergies as of 03/17/2012 - Review Complete 03/17/2012  Allergen Reaction Noted  . Codeine Shortness Of Breath and Palpitations 07/16/2011  . Hctz (hydrochlorothiazide) Dermatitis 03/17/2012  . Sudafed (pseudoephedrine hcl) Shortness Of Breath and Palpitations 07/16/2011  . Anesthetics, amide  07/16/2011    Your current list of medications are: Current Outpatient Prescriptions  Medication Sig Dispense Refill  . amLODipine (NORVASC) 2.5 MG tablet Take 2.5 mg by mouth daily.      Marland Kitchen aspirin 81 MG tablet Take 81 mg by mouth daily.       . Calcium Carbonate-Vitamin D (CALCIUM 600+D) 600-200 MG-UNIT TABS Take by mouth daily.      . cetirizine (ZYRTEC) 10 MG tablet Take 10 mg by mouth as needed.      Marland Kitchen glimepiride (AMARYL) 4 MG tablet Take 4 mg by mouth daily before breakfast.      . losartan (COZAAR) 50 MG tablet Take 50 mg by mouth daily.      . metFORMIN (GLUCOPHAGE) 500 MG tablet Take 500 mg by mouth 2 (two) times daily with a meal. 500mg  in am 1000mg  in pm      . omeprazole (PRILOSEC) 20 MG capsule Take 20 mg by mouth daily.      . pravastatin (PRAVACHOL) 40 MG tablet Take 40 mg by mouth daily.      . calcium carbonate (TUMS - DOSED IN MG ELEMENTAL CALCIUM) 500 MG chewable tablet Chew 1 tablet by mouth as needed.         INSTRUCTIONS GIVEN AND DISCUSSED:  See attached schedule   SPECIAL INSTRUCTIONS/FOLLOW-UP:  See above.  I acknowledge that I have been informed and understand all the instructions given to me and received a copy.I know to contact the clinic, my physician, or  go to the emergency Department if any problems should occur.   I do not have any more questions at this time, but understand that I may call the [facility] at [phone] during business hours should I have any further questions or need assistance in obtaining follow-up care.     [facility]  Discharge Instructions  RECOMMENDATIONS MADE BY THE CONSULTANT AND ANY TEST RESULTS WILL BE SENT TO YOUR REFERRING DOCTOR.   Pneumonia vaccine given today.  Be sure to get your Flu Shot, flu season is fast approaching [place]. Report any issues/concerns to clinic as needed. Return to clinic as scheduled for MD appointment.   I acknowledge that I have been informed and understand all the instructions given to me and received a copy. I do not have any more questions at this time, but understand that I may call the Specialty Clinic at [facility] at [phone] during business hours should I have any further questions or need assistance in obtaining follow-up care.    __________________________________________  _____________  __________ Signature of Patient or Authorized Representative            Date  Time    __________________________________________ Nurse's Signature

## 2012-03-17 NOTE — Progress Notes (Signed)
CC:   [name] Flavors, MD, Fax [phone] Dalia Heading, M.D.  IDENTIFYING STATEMENT:  The patient is a 66 year old woman with diffuse large B-cell lymphoma who presented for followup.  INTERVAL HISTORY:  The patient lives in [place].  She was seen 8 months ago.  During the last 8 months, she has had no concerns.  She denies B symptoms.  Her weight is stable.  She has no nausea, vomiting, or abdominal pain.  She has not noticed any adenopathy.  She is due for mammogram this year.  She is due for colonoscopy next year.  MEDICATIONS:  Reviewed and updated.  ALLERGIES:  Codeine, hydrochlorothiazide, Sudafed, and amide.  PHYSICAL EXAMINATION:  General:  The patient is alert and oriented x3. Vitals:  Pulse 72, blood pressure 150/72, temperature 97.8, respirations 16, weight 159 pounds.  HEENT:  Head is atraumatic, normocephalic. Sclerae anicteric.  Mouth moist.  Neck:  Supple.  Chest:  Clear to percussion and auscultation.  Cardiovascular:  First and second heart sounds present.  No added sounds or murmurs.  Abdomen:  Soft and nontender.  No masses.  Bowels sounds present.  Extremities:  No calf tenderness.  Lymph nodes:  No palpable cervical, axillary, or inguinal adenopathy.  LABORATORY DATA:  Pending.  IMPRESSION AND PLAN:  Ms. [name] [name] is a 66 year old woman with stage I diffuse large B-cell lymphoma (CD20-positive) who is status post Rituxan with CHOP in 2003 finishing in May 2004 and then went on to receive involved-field radiation.  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  The patient's exam indicates there is palpable adenopathy.  She has not had lab work yet but before she is discharged from the clinic she will get blood work and we will be telephoning her with the results.  If all is well, she will followup in a year's time. She will receive pneumonia shot before she leaves.  She declined flu shot.    ______________________________ Laurice Record, M.D. LIO/MEDQ  D:  03/17/2012  T:  03/17/2012  Job:  [id number]

## 2012-03-18 ENCOUNTER — Telehealth: Payer: Self-pay | Admitting: *Deleted

## 2012-03-18 ENCOUNTER — Other Ambulatory Visit: Payer: Self-pay | Admitting: *Deleted

## 2012-03-18 NOTE — Telephone Encounter (Signed)
Spoke with pt at home and informed pt re: labs results very good as per md.   Pt voiced understanding.

## 2013-03-12 ENCOUNTER — Other Ambulatory Visit (HOSPITAL_COMMUNITY): Payer: Self-pay

## 2013-03-12 DIAGNOSIS — C8590 Non-Hodgkin lymphoma, unspecified, unspecified site: Secondary | ICD-10-CM

## 2013-03-17 ENCOUNTER — Encounter (HOSPITAL_BASED_OUTPATIENT_CLINIC_OR_DEPARTMENT_OTHER): Payer: Medicare Other

## 2013-03-17 ENCOUNTER — Encounter (HOSPITAL_COMMUNITY): Payer: Medicare Other | Attending: Hematology and Oncology

## 2013-03-17 ENCOUNTER — Encounter (HOSPITAL_COMMUNITY): Payer: Self-pay

## 2013-03-17 VITALS — BP 157/67 | HR 72 | Temp 97.2°F | Resp 18 | Wt 161.4 lb

## 2013-03-17 DIAGNOSIS — C8589 Other specified types of non-Hodgkin lymphoma, extranodal and solid organ sites: Secondary | ICD-10-CM

## 2013-03-17 DIAGNOSIS — I10 Essential (primary) hypertension: Secondary | ICD-10-CM

## 2013-03-17 DIAGNOSIS — C8590 Non-Hodgkin lymphoma, unspecified, unspecified site: Secondary | ICD-10-CM

## 2013-03-17 LAB — COMPREHENSIVE METABOLIC PANEL
ALT: 14 U/L (ref 0–35)
Alkaline Phosphatase: 78 U/L (ref 39–117)
BUN: 13 mg/dL (ref 6–23)
CO2: 28 mEq/L (ref 19–32)
Calcium: 9.7 mg/dL (ref 8.4–10.5)
Creatinine, Ser: 0.84 mg/dL (ref 0.50–1.10)
GFR calc Af Amer: 80 mL/min — ABNORMAL LOW (ref >90)
GFR calc non Af Amer: 69 mL/min — ABNORMAL LOW (ref >90)
Glucose, Bld: 206 mg/dL — ABNORMAL HIGH (ref 70–99)
Potassium: 4.1 mEq/L (ref 3.5–5.1)
Sodium: 127 mEq/L — ABNORMAL LOW (ref 135–145)
Total Bilirubin: 0.4 mg/dL (ref 0.3–1.2)
Total Protein: 6.8 g/dL (ref 6.0–8.3)

## 2013-03-17 LAB — CBC WITH DIFFERENTIAL/PLATELET
Eosinophils Absolute: 0.1 10*3/uL (ref 0.0–0.7)
HCT: 34 % — ABNORMAL LOW (ref 36.0–46.0)
Hemoglobin: 12.2 g/dL (ref 12.0–15.0)
Lymphocytes Relative: 29 % (ref 12–46)
Lymphs Abs: 1.1 10*3/uL (ref 0.7–4.0)
MCH: 31.9 pg (ref 26.0–34.0)
MCV: 88.8 fL (ref 78.0–100.0)
Monocytes Absolute: 0.3 10*3/uL (ref 0.1–1.0)
Monocytes Relative: 7 % (ref 3–12)
Neutrophils Relative %: 62 % (ref 43–77)
Platelets: 157 10*3/uL (ref 150–400)
RBC: 3.83 MIL/uL — ABNORMAL LOW (ref 3.87–5.11)
WBC: 4 10*3/uL (ref 4.0–10.5)

## 2013-03-17 LAB — LACTATE DEHYDROGENASE: LDH: 157 U/L (ref 94–250)

## 2013-03-17 NOTE — Progress Notes (Signed)
Labs drawn today for cbc/diff,cmp,ferr,Beta 2 mic,ldh

## 2013-03-17 NOTE — Progress Notes (Signed)
[facility] OFFICE PROGRESS NOTE  [name], MD [address] [address]  DIAGNOSIS: Lymphoma - Plan: Beta 2 microglobuline, serum, Ferritin, CBC with Differential, Reticulocytes, Comprehensive metabolic panel, Lactate dehydrogenase, Beta 2 microglobuline, serum  Chief Complaint  Patient presents with  . Lymphoma    CURRENT THERAPY: Watchful expectation to  INTERVAL HISTORY: [name] [name] 67 y.o. female returns for oncologic followup after being treated with 6 cycles of CHOP-R. 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 She continues to do well with good appetite and no problems with swallowing, fever, night sweats, cough, wheezing, peripheral paresthesias, abdominal pain, nausea, vomiting, diarrhea, constipation, dysuria, hematuria, melena, hematochezia, vaginal bleeding, lower extremity swelling or redness, joint pain other than stated hands in the morning, skin rash, headache, or seizures.   MEDICAL HISTORY: Past Medical History  Diagnosis Date  . Diabetes mellitus   . Hypertension   . Lymphoma   . History of shingles   . History of [facility] spotted fever   . Sinusitis, chronic     INTERIM HISTORY: has Lymphoma on her problem list.    ALLERGIES:  is allergic to codeine; hctz; sudafed; and anesthetics, amide.  MEDICATIONS: has a current medication list which includes the following prescription(s): amlodipine, aspirin, calcium carbonate, calcium carbonate-vitamin d, cetirizine, glimepiride, losartan, metformin, omeprazole, and pravastatin.  SURGICAL HISTORY:  Past Surgical History  Procedure Laterality Date  . Cholecystectomy    . Tonsillectomy    . Refractive surgery    . Lump removal in throat    . Portacath placement    . Port-a-cath removal    . Colonoscopy    . Esophagogastroduodenoscopy      FAMILY HISTORY: family history includes Diabetes in  her brother; Hypertension in her brother, father, mother, and sister.  SOCIAL HISTORY:  reports that she has never smoked. She has never used smokeless tobacco. She reports that she does not drink alcohol or use illicit drugs.  REVIEW OF SYSTEMS:  Other than that discussed above is noncontributory.  PHYSICAL EXAMINATION: ECOG PERFORMANCE STATUS: 0 - Asymptomatic  There were no vitals taken for this visit.  GENERAL:alert, no distress and comfortable SKIN: skin color, texture, turgor are normal, no rashes or significant lesions EYES: normal, Conjunctiva are pink and non-injected, sclera clear OROPHARYNX:no exudate, no erythema and lips, buccal mucosa, and tongue normal  NECK: Slighly ligneous feel, thyroid normal size, non-tender, without nodularity CHEST: Normal AP diameter with no breast masses. LYMPH:  no palpable lymphadenopathy in the cervical, axillary or inguinal LUNGS: clear to auscultation and percussion with normal breathing effort HEART: regular rate & rhythm and no murmurs and no lower extremity edema ABDOMEN:abdomen soft, non-tender and normal bowel sounds Musculoskeletal:no cyanosis of digits and no clubbing  NEURO: alert & oriented x 3 with fluent speech, no focal motor/sensory deficits   LABORATORY DATA: No visits with results within 30 Day(s) from this visit. Latest known visit with results is:  Office Visit on 03/17/2012  Component Date Value Range Status  . WBC 03/17/2012 3.8* 4.0 - 10.5 K/uL Final  . RBC 03/17/2012 3.89  3.87 - 5.11 MIL/uL Final  . Hemoglobin 03/17/2012 12.1  12.0 - 15.0 g/dL Final  . HCT 62/95/2841 35.0* 36.0 - 46.0 % Final  . MCV 03/17/2012 90.0  78.0 - 100.0 fL Final  . MCH 03/17/2012 31.1  26.0 - 34.0 pg Final  . MCHC 03/17/2012  34.6  30.0 - 36.0 g/dL Final  . RDW 16/03/9603 12.2  11.5 - 15.5 % Final  . Platelets 03/17/2012 167  150 - 400 K/uL Final  . Neutrophils Relative % 03/17/2012 64  43 - 77 % Final  . Neutro Abs 03/17/2012 2.4   1.7 - 7.7 K/uL Final  . Lymphocytes Relative 03/17/2012 26  12 - 46 % Final  . Lymphs Abs 03/17/2012 1.0  0.7 - 4.0 K/uL Final  . Monocytes Relative 03/17/2012 7  3 - 12 % Final  . Monocytes Absolute 03/17/2012 0.3  0.1 - 1.0 K/uL Final  . Eosinophils Relative 03/17/2012 2  0 - 5 % Final  . Eosinophils Absolute 03/17/2012 0.1  0.0 - 0.7 K/uL Final  . Basophils Relative 03/17/2012 1  0 - 1 % Final  . Basophils Absolute 03/17/2012 0.0  0.0 - 0.1 K/uL Final  . Sodium 03/17/2012 133* 135 - 145 mEq/L Final  . Potassium 03/17/2012 4.0  3.5 - 5.1 mEq/L Final  . Chloride 03/17/2012 97  96 - 112 mEq/L Final  . CO2 03/17/2012 29  19 - 32 mEq/L Final  . Glucose, Bld 03/17/2012 150* 70 - 99 mg/dL Final  . BUN 54/02/8118 14  6 - 23 mg/dL Final  . Creatinine, Ser 03/17/2012 0.73  0.50 - 1.10 mg/dL Final  . Calcium 14/78/2956 9.6  8.4 - 10.5 mg/dL Final  . Total Protein 03/17/2012 6.5  6.0 - 8.3 g/dL Final  . Albumin 21/30/8657 3.9  3.5 - 5.2 g/dL Final  . AST 84/69/6295 17  0 - 37 U/L Final  . ALT 03/17/2012 17  0 - 35 U/L Final  . Alkaline Phosphatase 03/17/2012 79  39 - 117 U/L Final  . Total Bilirubin 03/17/2012 0.4  0.3 - 1.2 mg/dL Final  . GFR calc non Af Amer 03/17/2012 86* >90 mL/min Final  . GFR calc Af Amer 03/17/2012 >90  >90 mL/min Final   Comment:                                 The eGFR has been calculated                          using the CKD EPI equation.                          This calculation has not been                          validated in all clinical                          situations.                          eGFR's persistently                          <90 mL/min signify                          possible Chronic Kidney Disease.  Marland Kitchen LDH 03/17/2012 171  94 - 250 U/L Final     Urinalysis No results found for this basename: colorurine,  appearanceur,  labspec,  phurine,  glucoseu,  hgbur,  bilirubinur,  ketonesur,  proteinur,  urobilinogen,  nitrite,  leukocytesur     RADIOGRAPHIC STUDIES: No results found.  ASSESSMENT: #1. Stage I diffuse large B-cell lymphoma diagnosed in 2003, status post 6 cycles of CHOP-R. followed by involved field radiotherapy, no evidence of disease #2. Hypertension, on treatment, controlled.   PLAN: #1. If any lab tests are abnormal, the patient will be contacted for followup and additional testing. #2. Followup in one year. #3. Told to call should any new does appear, fever night sweats, anorexia, or any persistent or troublesome symptom that may develop   All questions were answered. The patient knows to call the clinic with any problems, questions or concerns. We can certainly see the patient much sooner if necessary.   I spent 30 minutes counseling the patient face to face. The total time spent in the appointment was 25 minutes.    Maurilio Lovely, MD 03/17/2013 9:32 AM

## 2013-03-17 NOTE — Patient Instructions (Addendum)
[facility] Discharge Instructions  RECOMMENDATIONS MADE BY THE CONSULTANT AND ANY TEST RESULTS WILL BE SENT TO YOUR REFERRING PHYSICIAN.  EXAM FINDINGS BY THE PHYSICIAN TODAY AND SIGNS OR SYMPTOMS TO REPORT TO CLINIC OR PRIMARY PHYSICIAN: Exam and findings as discussed by Dr. Zigmund Daniel.  Report night sweats,unexplained fevers, unexplained weight loss or other problems.  MEDICATIONS PRESCRIBED:  none  INSTRUCTIONS/FOLLOW-UP: Follow-up in 1 year.  Thank you for choosing [name] to provide your oncology and hematology care.  To afford each patient quality time with our providers, please arrive at least 15 minutes before your scheduled appointment time.  With your help, our goal is to use those 15 minutes to complete the necessary work-up to ensure our physicians have the information they need to help with your evaluation and healthcare recommendations.    Effective January 1st, 2014, we ask that you re-schedule your appointment with our physicians should you arrive 10 or more minutes late for your appointment.  We strive to give you quality time with our providers, and arriving late affects you and other patients whose appointments are after yours.    Again, thank you for choosing [facility].  Our hope is that these requests will decrease the amount of time that you wait before being seen by our physicians.       _____________________________________________________________  Should you have questions after your visit to [facility], please contact our office at [phone] between the hours of 8:30 a.m. and 5:00 p.m.  Voicemails left after 4:30 p.m. will not be returned until the following business day.  For prescription refill requests, have your pharmacy contact our office with your prescription refill request.

## 2014-03-17 ENCOUNTER — Encounter (HOSPITAL_COMMUNITY): Payer: Medicare Other | Attending: Hematology and Oncology

## 2014-03-17 ENCOUNTER — Encounter (HOSPITAL_COMMUNITY): Payer: Self-pay

## 2014-03-17 ENCOUNTER — Encounter (HOSPITAL_BASED_OUTPATIENT_CLINIC_OR_DEPARTMENT_OTHER): Payer: Medicare Other

## 2014-03-17 VITALS — BP 158/61 | HR 74 | Temp 97.9°F | Resp 18 | Wt 160.2 lb

## 2014-03-17 DIAGNOSIS — C8590 Non-Hodgkin lymphoma, unspecified, unspecified site: Secondary | ICD-10-CM

## 2014-03-17 DIAGNOSIS — K219 Gastro-esophageal reflux disease without esophagitis: Secondary | ICD-10-CM | POA: Diagnosis not present

## 2014-03-17 DIAGNOSIS — I10 Essential (primary) hypertension: Secondary | ICD-10-CM | POA: Diagnosis not present

## 2014-03-17 DIAGNOSIS — C8589 Other specified types of non-Hodgkin lymphoma, extranodal and solid organ sites: Secondary | ICD-10-CM

## 2014-03-17 DIAGNOSIS — E119 Type 2 diabetes mellitus without complications: Secondary | ICD-10-CM | POA: Insufficient documentation

## 2014-03-17 LAB — CBC WITH DIFFERENTIAL/PLATELET
Basophils Absolute: 0 10*3/uL (ref 0.0–0.1)
Basophils Relative: 1 % (ref 0–1)
EOS PCT: 2 % (ref 0–5)
Eosinophils Absolute: 0.1 10*3/uL (ref 0.0–0.7)
HCT: 34.6 % — ABNORMAL LOW (ref 36.0–46.0)
HEMOGLOBIN: 12.4 g/dL (ref 12.0–15.0)
LYMPHS ABS: 1.1 10*3/uL (ref 0.7–4.0)
LYMPHS PCT: 28 % (ref 12–46)
MCH: 31.2 pg (ref 26.0–34.0)
MCHC: 35.8 g/dL (ref 30.0–36.0)
MCV: 87.2 fL (ref 78.0–100.0)
MONO ABS: 0.3 10*3/uL (ref 0.1–1.0)
Monocytes Relative: 8 % (ref 3–12)
Neutro Abs: 2.5 10*3/uL (ref 1.7–7.7)
Neutrophils Relative %: 61 % (ref 43–77)
Platelets: 168 10*3/uL (ref 150–400)
RBC: 3.97 MIL/uL (ref 3.87–5.11)
RDW: 11.9 % (ref 11.5–15.5)
WBC: 4.1 10*3/uL (ref 4.0–10.5)

## 2014-03-17 LAB — COMPREHENSIVE METABOLIC PANEL
ALK PHOS: 88 U/L (ref 39–117)
ALT: 17 U/L (ref 0–35)
AST: 16 U/L (ref 0–37)
Albumin: 3.9 g/dL (ref 3.5–5.2)
Anion gap: 9 (ref 5–15)
BUN: 12 mg/dL (ref 6–23)
CO2: 29 meq/L (ref 19–32)
Calcium: 9.4 mg/dL (ref 8.4–10.5)
Chloride: 94 mEq/L — ABNORMAL LOW (ref 96–112)
Creatinine, Ser: 0.84 mg/dL (ref 0.50–1.10)
GFR calc non Af Amer: 69 mL/min — ABNORMAL LOW (ref >90)
GFR, EST AFRICAN AMERICAN: 80 mL/min — AB (ref >90)
Glucose, Bld: 205 mg/dL — ABNORMAL HIGH (ref 70–99)
POTASSIUM: 5 meq/L (ref 3.7–5.3)
SODIUM: 132 meq/L — AB (ref 137–147)
TOTAL PROTEIN: 6.8 g/dL (ref 6.0–8.3)
Total Bilirubin: 0.4 mg/dL (ref 0.3–1.2)

## 2014-03-17 LAB — LACTATE DEHYDROGENASE: LDH: 166 U/L (ref 94–250)

## 2014-03-17 LAB — RETICULOCYTES
RBC.: 3.97 MIL/uL (ref 3.87–5.11)
RETIC CT PCT: 1.6 % (ref 0.4–3.1)
Retic Count, Absolute: 63.5 10*3/uL (ref 19.0–186.0)

## 2014-03-17 NOTE — Progress Notes (Signed)
Labs for cbcd,retic,cmp,ldh,b75m

## 2014-03-17 NOTE — Patient Instructions (Signed)
[facility] Discharge Instructions  RECOMMENDATIONS MADE BY THE CONSULTANT AND ANY TEST RESULTS WILL BE SENT TO YOUR REFERRING PHYSICIAN.  We will see you in 1 year for repeat lab work and a [place] appointment. Please call for any questions or concerns.    Thank you for choosing [facility] to provide your oncology and hematology care.  To afford each patient quality time with our providers, please arrive at least 15 minutes before your scheduled appointment time.  With your help, our goal is to use those 15 minutes to complete the necessary work-up to ensure our physicians have the information they need to help with your evaluation and healthcare recommendations.    Effective January 1st, 2014, we ask that you re-schedule your appointment with our physicians should you arrive 10 or more minutes late for your appointment.  We strive to give you quality time with our providers, and arriving late affects you and other patients whose appointments are after yours.    Again, thank you for choosing [facility].  Our hope is that these requests will decrease the amount of time that you wait before being seen by our physicians.       _____________________________________________________________  Should you have questions after your visit to [facility], please contact our office at [phone] between the hours of 8:30 a.m. and 4:30 p.m.  Voicemails left after 4:30 p.m. will not be returned until the following business day.  For prescription refill requests, have your pharmacy contact our office with your prescription refill request.    _______________________________________________________________  We hope that we have given you very good care.  You may receive a patient satisfaction survey in the mail, please complete it and return it as soon as possible.  We value your  feedback!  _______________________________________________________________  Have you asked about our STAR program?  STAR stands for Survivorship Training and Rehabilitation, and this is a nationally recognized cancer care program that focuses on survivorship and rehabilitation.  Cancer and cancer treatments may cause problems, such as, pain, making you feel tired and keeping you from doing the things that you need or want to do. Cancer rehabilitation can help. Our goal is to reduce these troubling effects and help you have the best quality of life possible.  You may receive a survey from a nurse that asks questions about your current state of health.  Based on the survey results, all eligible patients will be referred to the [facility] program for an evaluation so we can better serve you!  A frequently asked questions sheet is available upon request.

## 2014-03-17 NOTE — Progress Notes (Signed)
[facility]  OFFICE PROGRESS NOTE  Talmage Coin, MD [place] [id number]  DIAGNOSIS: Lymphoma - Plan: CBC with Differential, Lactate dehydrogenase, Beta 2 microglobulin, serum, Reticulocytes, Ferritin, Comprehensive metabolic panel  Chief Complaint  Patient presents with  . Large cell lymphoma  . Diabetes mellitus, type II, non-insulin requiring  . Gastroesophageal reflux disease    CURRENT THERAPY: Watchful expectation  INTERVAL HISTORY: [name] 68 y.o. female returns for followup after being treated with 6 cycles of CHOP-R. 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 She denies any xerostomia, fever, night sweats, cough, wheezing, abdominal pain, nausea, vomiting, diarrhea, constipation, melena, hematochezia, hematuria, urinary fecal incontinence, lower extremity swelling or redness, skin rash, chest pain, PND, orthopnea, palpitations, headache, or seizures.    MEDICAL HISTORY: Past Medical History  Diagnosis Date  . Diabetes mellitus   . Hypertension   . Lymphoma   . History of shingles   . History of [facility] spotted fever   . Sinusitis, chronic   . GERD (gastroesophageal reflux disease)     INTERIM HISTORY: has Lymphoma on her problem list.    ALLERGIES:  is allergic to codeine; hctz; sudafed; and anesthetics, amide.  MEDICATIONS: has a current medication list which includes the following prescription(s): amlodipine, aspirin, calcium carbonate, calcium carbonate-vitamin d, cetirizine, glimepiride, losartan, metformin, nateglinide, omeprazole, and pravastatin.  SURGICAL HISTORY:  Past Surgical History  Procedure Laterality Date  . Cholecystectomy    . Tonsillectomy    . Refractive surgery    . Lump removal in throat    . Portacath placement    . Port-a-cath removal    . Colonoscopy    .  Esophagogastroduodenoscopy      FAMILY HISTORY: family history includes Diabetes in her brother; Hypertension in her brother, father, mother, and sister.  SOCIAL HISTORY:  reports that she has never smoked. She has never used smokeless tobacco. She reports that she does not drink alcohol or use illicit drugs.  REVIEW OF SYSTEMS:  Other than that discussed above is noncontributory.  PHYSICAL EXAMINATION: ECOG PERFORMANCE STATUS: 0 - Asymptomatic  Blood pressure 158/61, pulse 74, temperature 97.9 F (36.6 C), temperature source Oral, resp. rate 18, weight 160 lb 3.2 oz (72.666 kg), SpO2 95.00%.  GENERAL:alert, no distress and comfortable SKIN: skin color, texture, turgor are normal, no rashes or significant lesions EYES: PERLA; Conjunctiva are pink and non-injected, sclera clear SINUSES: No redness or tenderness over maxillary or ethmoid sinuses OROPHARYNX:no exudate, no erythema on lips, buccal mucosa, or tongue. NECK: supple, thyroid normal size, non-tender, without nodularity. No masses CHEST: Normal AP diameter with no breast masses. LYMPH:  no palpable lymphadenopathy in the cervical, axillary or inguinal LUNGS: clear to auscultation and percussion with normal breathing effort HEART: regular rate & rhythm and no murmurs. ABDOMEN:abdomen soft, non-tender and normal bowel sounds MUSCULOSKELETAL:no cyanosis of digits and no clubbing. Range of motion normal.  NEURO: alert & oriented x 3 with fluent speech, no focal motor/sensory deficits   LABORATORY DATA: Lab on 03/17/2014  Component Date Value Ref Range Status  . WBC 03/17/2014 4.1  4.0 - 10.5 K/uL Final  . RBC 03/17/2014 3.97  3.87 - 5.11 MIL/uL Final  . Hemoglobin 03/17/2014 12.4  12.0 - 15.0 g/dL Final  . HCT 03/17/2014 34.6* 36.0 - 46.0 % Final  . MCV 03/17/2014 87.2  78.0 - 100.0 fL Final  . MCH 03/17/2014 31.2  26.0 - 34.0 pg Final  . MCHC 03/17/2014 35.8  30.0 - 36.0 g/dL Final  . RDW 03/17/2014 11.9  11.5 - 15.5 %  Final  . Platelets 03/17/2014 168  150 - 400 K/uL Final  . Neutrophils Relative % 03/17/2014 61  43 - 77 % Final  . Neutro Abs 03/17/2014 2.5  1.7 - 7.7 K/uL Final  . Lymphocytes Relative 03/17/2014 28  12 - 46 % Final  . Lymphs Abs 03/17/2014 1.1  0.7 - 4.0 K/uL Final  . Monocytes Relative 03/17/2014 8  3 - 12 % Final  . Monocytes Absolute 03/17/2014 0.3  0.1 - 1.0 K/uL Final  . Eosinophils Relative 03/17/2014 2  0 - 5 % Final  . Eosinophils Absolute 03/17/2014 0.1  0.0 - 0.7 K/uL Final  . Basophils Relative 03/17/2014 1  0 - 1 % Final  . Basophils Absolute 03/17/2014 0.0  0.0 - 0.1 K/uL Final  . Retic Ct Pct 03/17/2014 1.6  0.4 - 3.1 % Final  . RBC. 03/17/2014 3.97  3.87 - 5.11 MIL/uL Final  . Retic Count, Manual 03/17/2014 63.5  19.0 - 186.0 K/uL Final  . Sodium 03/17/2014 132* 137 - 147 mEq/L Final  . Potassium 03/17/2014 5.0  3.7 - 5.3 mEq/L Final  . Chloride 03/17/2014 94* 96 - 112 mEq/L Final  . CO2 03/17/2014 29  19 - 32 mEq/L Final  . Glucose, Bld 03/17/2014 205* 70 - 99 mg/dL Final  . BUN 03/17/2014 12  6 - 23 mg/dL Final  . Creatinine, Ser 03/17/2014 0.84  0.50 - 1.10 mg/dL Final  . Calcium 03/17/2014 9.4  8.4 - 10.5 mg/dL Final  . Total Protein 03/17/2014 6.8  6.0 - 8.3 g/dL Final  . Albumin 03/17/2014 3.9  3.5 - 5.2 g/dL Final  . AST 03/17/2014 16  0 - 37 U/L Final  . ALT 03/17/2014 17  0 - 35 U/L Final  . Alkaline Phosphatase 03/17/2014 88  39 - 117 U/L Final  . Total Bilirubin 03/17/2014 0.4  0.3 - 1.2 mg/dL Final  . GFR calc non Af Amer 03/17/2014 69* >90 mL/min Final  . GFR calc Af Amer 03/17/2014 80* >90 mL/min Final   Comment: (NOTE)                          The eGFR has been calculated using the CKD EPI equation.                          This calculation has not been validated in all clinical situations.                          eGFR's persistently <90 mL/min signify possible Chronic Kidney                          Disease.  . Anion gap 03/17/2014 9  5 - 15  Final  . LDH 03/17/2014 166  94 - 250 U/L Final    PATHOLOGY: Diffuse large B-cell lymphoma  Urinalysis No results found for this basename: colorurine,  appearanceur,  labspec,  phurine,  glucoseu,  hgbur,  bilirubinur,  ketonesur,  proteinur,  urobilinogen,  nitrite,  leukocytesur    RADIOGRAPHIC STUDIES: No results found.  ASSESSMENT:  #1. Stage I diffuse large B-cell lymphoma  diagnosed in 2003, status post 6 cycles of CHOP-R. followed by involved field radiotherapy, no evidence of disease  #2. Hypertension, on treatment, controlled    PLAN:  #1. Followup in one year with CBC, chem profile, LDH, beta-2 microglobulin. Patient was told to call should any new symptoms occur in the interim that are troublesome and persistent.   All questions were answered. The patient knows to call the clinic with any problems, questions or concerns. We can certainly see the patient much sooner if necessary.   I spent 25 minutes counseling the patient face to face. The total time spent in the appointment was 30 minutes.    Doroteo Bradford, MD 03/17/2014 9:41 AM  DISCLAIMER:  This note was dictated with voice recognition software.  Similar sounding words can inadvertently be transcribed inaccurately and may not be corrected upon review.

## 2014-03-19 LAB — BETA 2 MICROGLOBULIN, SERUM: BETA 2 MICROGLOBULIN: 2.37 mg/L — AB (ref <=2.51)

## 2015-03-18 ENCOUNTER — Encounter (HOSPITAL_COMMUNITY): Payer: Self-pay | Admitting: Hematology & Oncology

## 2015-03-18 ENCOUNTER — Encounter (HOSPITAL_COMMUNITY): Payer: Medicare Other | Attending: Hematology & Oncology

## 2015-03-18 ENCOUNTER — Encounter (HOSPITAL_BASED_OUTPATIENT_CLINIC_OR_DEPARTMENT_OTHER): Payer: Medicare Other | Admitting: Hematology & Oncology

## 2015-03-18 VITALS — BP 157/53 | HR 72 | Temp 97.6°F | Resp 18 | Wt 161.3 lb

## 2015-03-18 DIAGNOSIS — C8590 Non-Hodgkin lymphoma, unspecified, unspecified site: Secondary | ICD-10-CM | POA: Insufficient documentation

## 2015-03-18 DIAGNOSIS — Z8572 Personal history of non-Hodgkin lymphomas: Secondary | ICD-10-CM | POA: Diagnosis not present

## 2015-03-18 LAB — COMPREHENSIVE METABOLIC PANEL
ALK PHOS: 67 U/L (ref 38–126)
ALT: 29 U/L (ref 14–54)
ANION GAP: 7 (ref 5–15)
AST: 23 U/L (ref 15–41)
Albumin: 4 g/dL (ref 3.5–5.0)
BILIRUBIN TOTAL: 0.8 mg/dL (ref 0.3–1.2)
BUN: 19 mg/dL (ref 6–20)
CALCIUM: 9.2 mg/dL (ref 8.9–10.3)
CO2: 28 mmol/L (ref 22–32)
Chloride: 95 mmol/L — ABNORMAL LOW (ref 101–111)
Creatinine, Ser: 0.83 mg/dL (ref 0.44–1.00)
GLUCOSE: 124 mg/dL — AB (ref 65–99)
Potassium: 4.5 mmol/L (ref 3.5–5.1)
Sodium: 130 mmol/L — ABNORMAL LOW (ref 135–145)
TOTAL PROTEIN: 6.4 g/dL — AB (ref 6.5–8.1)

## 2015-03-18 LAB — CBC WITH DIFFERENTIAL/PLATELET
Basophils Absolute: 0 10*3/uL (ref 0.0–0.1)
Basophils Relative: 1 %
Eosinophils Absolute: 0.1 10*3/uL (ref 0.0–0.7)
Eosinophils Relative: 2 %
HEMATOCRIT: 33.7 % — AB (ref 36.0–46.0)
HEMOGLOBIN: 11.9 g/dL — AB (ref 12.0–15.0)
LYMPHS PCT: 28 %
Lymphs Abs: 1.3 10*3/uL (ref 0.7–4.0)
MCH: 31.5 pg (ref 26.0–34.0)
MCHC: 35.3 g/dL (ref 30.0–36.0)
MCV: 89.2 fL (ref 78.0–100.0)
MONOS PCT: 9 %
Monocytes Absolute: 0.4 10*3/uL (ref 0.1–1.0)
NEUTROS ABS: 2.8 10*3/uL (ref 1.7–7.7)
Neutrophils Relative %: 60 %
Platelets: 177 10*3/uL (ref 150–400)
RBC: 3.78 MIL/uL — ABNORMAL LOW (ref 3.87–5.11)
RDW: 11.9 % (ref 11.5–15.5)
WBC: 4.6 10*3/uL (ref 4.0–10.5)

## 2015-03-18 LAB — RETICULOCYTES
RBC.: 3.78 MIL/uL — AB (ref 3.87–5.11)
RETIC CT PCT: 1.6 % (ref 0.4–3.1)
Retic Count, Absolute: 60.5 10*3/uL (ref 19.0–186.0)

## 2015-03-18 LAB — FERRITIN: Ferritin: 19 ng/mL (ref 11–307)

## 2015-03-18 LAB — LACTATE DEHYDROGENASE: LDH: 147 U/L (ref 98–192)

## 2015-03-18 NOTE — Progress Notes (Signed)
LABS DRAWN

## 2015-03-18 NOTE — Progress Notes (Signed)
[name] NOTE  Patient Care Team: Talmage Coin, MD as PCP - General (Family Medicine)  CHIEF COMPLAINTS/PURPOSE OF CONSULTATION:  Stage I DLBCL, 6 cycles of R CHOP, IMRT to neck completed in 2004  HISTORY OF PRESENTING ILLNESS:  [name] 69 y.o. female is here for yearly follow-up on her lymphoma, which was 13 years ago. She is here alone today.  [name] [name] receives her mammograms in [place]. She states that she is usually pretty good about her follow-ups and preventative scans, and has "probably a couple more years" before her colonoscopy comes up.   With regards to her past cancer treatment, she reports that she did great with her chemotherapy, and that it was "surprising" how well she did. She remarks that the radiation was harder for her than the chemotherapy.  She confirms having a good appetite, and denies any lumps or bumps anywhere. She indicates that her radiation was done in the neck region and on the left side of her jaw. She denies any pain in that area.  She also denies any heart trouble since her chemotherapy. She states that she had to do a stress test and echocardiogram, and that the "walls of her heart turned out fine."  Her primary care doctor is in [place]. She states that she "sees that man every 4 months, whether she wants to or not."  [name] reports that, for her cancer, she had to come down here because of her insurance, but she is grateful that she did.  MEDICAL HISTORY:  Past Medical History  Diagnosis Date  . Diabetes mellitus   . Hypertension   . Lymphoma   . History of shingles   . History of [facility] spotted fever   . Sinusitis, chronic   . GERD (gastroesophageal reflux disease)     SURGICAL HISTORY: Past Surgical History  Procedure Laterality Date  . Cholecystectomy    . Tonsillectomy    . Refractive surgery    . Lump removal in throat    . Portacath placement    . Port-a-cath  removal    . Colonoscopy    . Esophagogastroduodenoscopy      SOCIAL HISTORY: Social History   Social History  . Marital Status: Married    Spouse Name: N/A  . Number of Children: N/A  . Years of Education: N/A   Occupational History  . Not on file.   Social History Main Topics  . Smoking status: Never Smoker   . Smokeless tobacco: Never Used  . Alcohol Use: No  . Drug Use: No  . Sexual Activity: Not on file   Other Topics Concern  . Not on file   Social History Narrative   She has 3 children, 2 girls and a boy; 2 girls and 2 boy grandchildren (young adults). She has been married 73 years.  FAMILY HISTORY: Family History  Problem Relation Age of Onset  . Hypertension Mother   . Hypertension Father   . Hypertension Sister   . Diabetes Brother   . Hypertension Brother    indicated that her mother is deceased. She indicated that her father is deceased.   ALLERGIES:  is allergic to codeine; hctz; sudafed; and anesthetics, amide.  MEDICATIONS:  Current Outpatient Prescriptions  Medication Sig Dispense Refill  . amLODipine (NORVASC) 2.5 MG tablet Take 2.5 mg by mouth daily.    Marland Kitchen aspirin 81 MG tablet Take 81 mg by mouth daily.     Marland Kitchen  calcium carbonate (TUMS - DOSED IN MG ELEMENTAL CALCIUM) 500 MG chewable tablet Chew 1 tablet by mouth as needed.    . Calcium Carbonate-Vitamin D (CALCIUM 600+D) 600-200 MG-UNIT TABS Take by mouth daily.    . cetirizine (ZYRTEC) 10 MG tablet Take 10 mg by mouth as needed.    Marland Kitchen glimepiride (AMARYL) 4 MG tablet Take 4 mg by mouth daily before breakfast.    . losartan (COZAAR) 50 MG tablet Take 50 mg by mouth daily.    . metFORMIN (GLUCOPHAGE) 500 MG tablet Take 500 mg by mouth 2 (two) times daily with a meal. 500mg  in am 1000mg  in pm    . nateglinide (STARLIX) 120 MG tablet Take 120 mg by mouth 1 day or 1 dose.    Marland Kitchen omeprazole (PRILOSEC) 20 MG capsule Take 20 mg by mouth daily.    . pravastatin (PRAVACHOL) 40 MG tablet Take 40 mg by mouth  daily.     No current facility-administered medications for this visit.    Review of Systems  Constitutional: Negative for fever, chills, weight loss and malaise/fatigue.  HENT: Negative for congestion, hearing loss, nosebleeds, sore throat and tinnitus.   Eyes: Negative for blurred vision, double vision, pain and discharge.  Respiratory: Negative for cough, hemoptysis, sputum production, shortness of breath and wheezing.   Cardiovascular: Negative for chest pain, palpitations, claudication, leg swelling and PND.  Gastrointestinal: Negative for heartburn, nausea, vomiting, abdominal pain, diarrhea, constipation, blood in stool and melena.  Genitourinary: Negative for dysuria, urgency, frequency and hematuria.  Musculoskeletal: Negative for myalgias, joint pain and falls.  Skin: Negative for itching and rash.  Neurological: Negative for dizziness, tingling, tremors, sensory change, speech change, focal weakness, seizures, loss of consciousness, weakness and headaches.  Endo/Heme/Allergies: Does not bruise/bleed easily.  Psychiatric/Behavioral: Negative for depression, suicidal ideas, memory loss and substance abuse. The patient is not nervous/anxious and does not have insomnia.   All other systems reviewed and are negative.  14 point ROS was done and is otherwise as detailed above or in HPI  PHYSICAL EXAMINATION: ECOG PERFORMANCE STATUS: 0 - Asymptomatic  Filed Vitals:   03/18/15 0800  BP: 157/53  Pulse: 72  Temp: 97.6 F (36.4 C)  Resp: 18   Filed Weights   03/18/15 0800  Weight: 161 lb 4.8 oz (73.165 kg)    Physical Exam  Constitutional: She is oriented to person, place, and time and well-developed, well-nourished, and in no distress.  HENT:  Head: Normocephalic and atraumatic.  Nose: Nose normal.  Mouth/Throat: Oropharynx is clear and moist. No oropharyngeal exudate.  Eyes: Conjunctivae and EOM are normal. Pupils are equal, round, and reactive to light. Right eye  exhibits no discharge. Left eye exhibits no discharge. No scleral icterus.  Neck: Normal range of motion. Neck supple. No tracheal deviation present. No thyromegaly present.  Skin changes ie. Firmness noted on left side of neck from XRT  Cardiovascular: Normal rate, regular rhythm and normal heart sounds.  Exam reveals no gallop and no friction rub.   No murmur heard. Pulmonary/Chest: Effort normal and breath sounds normal. She has no wheezes. She has no rales.  Abdominal: Soft. Bowel sounds are normal. She exhibits no distension and no mass. There is no tenderness. There is no rebound and no guarding.  Musculoskeletal: Normal range of motion. She exhibits no edema.  Lymphadenopathy:       Head (right side): No submental, no submandibular, no preauricular and no posterior auricular adenopathy present.  Head (left side): No submental, no submandibular, no preauricular and no posterior auricular adenopathy present.    She has no cervical adenopathy.       Right cervical: No superficial cervical, no deep cervical and no posterior cervical adenopathy present.      Left cervical: No superficial cervical, no deep cervical and no posterior cervical adenopathy present.       Right axillary: No lateral adenopathy present.       Left axillary: No pectoral and no lateral adenopathy present.      Right: No supraclavicular adenopathy present.       Left: No supraclavicular adenopathy present.  Neurological: She is alert and oriented to person, place, and time. She has normal reflexes. No cranial nerve deficit. Gait normal. Coordination normal.  Skin: Skin is warm and dry. No rash noted.  Psychiatric: Mood, memory, affect and judgment normal.  Nursing note and vitals reviewed.  LABORATORY DATA:  I have reviewed the data as listed Lab Results  Component Value Date   WBC 4.1 03/17/2014   HGB 12.4 03/17/2014   HCT 34.6* 03/17/2014   MCV 87.2 03/17/2014   PLT 168 03/17/2014   CMP     Component  Value Date/Time   NA 130* 03/18/2015 0859   K 4.5 03/18/2015 0859   CL 95* 03/18/2015 0859   CO2 28 03/18/2015 0859   GLUCOSE 124* 03/18/2015 0859   BUN 19 03/18/2015 0859   CREATININE 0.83 03/18/2015 0859   CALCIUM 9.2 03/18/2015 0859   PROT 6.4* 03/18/2015 0859   ALBUMIN 4.0 03/18/2015 0859   AST 23 03/18/2015 0859   ALT 29 03/18/2015 0859   ALKPHOS 67 03/18/2015 0859   BILITOT 0.8 03/18/2015 0859   GFRNONAA >60 03/18/2015 0859   GFRAA >60 03/18/2015 0859    ASSESSMENT & PLAN:  Stage I diffuse large B-cell lymphoma treated in 2004 No evidence of recurrence Patient up-to-date on well care including mammography and colonoscopy  She is doing very well. She has had no long-term complications of chemotherapy. She has had a cardiac evaluation in the recent past. She keeps up with all of her screening including mammography and colonoscopy. She would like to continue with yearly follow-up therefore we will arrange to see her back again next year with repeat studies. I advised her realistically there is not a strong need for [place] to follow her blood counts if her primary is doing so. She would like to continue to do those here with [place].  Orders Placed This Encounter  Procedures  . CBC with Differential    Standing Status: Future     Number of Occurrences:      Standing Expiration Date: 05/17/2016  . Comprehensive metabolic panel    Standing Status: Future     Number of Occurrences:      Standing Expiration Date: 05/17/2016  . Lactate dehydrogenase    Standing Status: Future     Number of Occurrences:      Standing Expiration Date: 05/17/2016  . Beta 2 microglobulin, serum    Standing Status: Future     Number of Occurrences:      Standing Expiration Date: 05/17/2016    All questions were answered. The patient knows to call the clinic with any problems, questions or concerns.  This document serves as a record of services personally performed by Ancil Linsey, MD. It was created  on her behalf by Toni Amend, a trained medical scribe. The creation of this record is based on  the scribe's personal observations and the provider's statements to them. This document has been checked and approved by the attending provider.  I have reviewed the above documentation for accuracy and completeness, and I agree with the above.  This note was electronically signed.    Molli Hazard, MD  03/18/2015 9:12 AM

## 2015-03-18 NOTE — Patient Instructions (Signed)
[facility] at [facility] Discharge Instructions  RECOMMENDATIONS MADE BY THE CONSULTANT AND ANY TEST RESULTS WILL BE SENT TO YOUR REFERRING PHYSICIAN.  Exam completed by Dr Whitney Muse today Return to see the doctor in 1 year with lab work Please call the clinic if you have any questions or concerns  Thank you for choosing [place] at [facility] to provide your oncology and hematology care.  To afford each patient quality time with our Acy Orsak, please arrive at least 15 minutes before your scheduled appointment time.    You need to re-schedule your appointment should you arrive 10 or more minutes late.  We strive to give you quality time with our providers, and arriving late affects you and other patients whose appointments are after yours.  Also, if you no show three or more times for appointments you may be dismissed from the clinic at the providers discretion.     Again, thank you for choosing [facility].  Our hope is that these requests will decrease the amount of time that you wait before being seen by our physicians.       _____________________________________________________________  Should you have questions after your visit to [facility], please contact our office at [phone] between the hours of 8:30 a.m. and 4:30 p.m.  Voicemails left after 4:30 p.m. will not be returned until the following business day.  For prescription refill requests, have your pharmacy contact our office.

## 2015-03-19 LAB — BETA 2 MICROGLOBULIN, SERUM: BETA 2 MICROGLOBULIN: 2.3 mg/L (ref 0.6–2.4)

## 2016-03-19 ENCOUNTER — Encounter (HOSPITAL_COMMUNITY): Payer: Medicare Other | Attending: Hematology & Oncology | Admitting: Hematology & Oncology

## 2016-03-19 ENCOUNTER — Encounter (HOSPITAL_COMMUNITY): Payer: Self-pay | Admitting: Hematology & Oncology

## 2016-03-19 ENCOUNTER — Encounter (HOSPITAL_COMMUNITY): Payer: Medicare Other

## 2016-03-19 VITALS — BP 158/61 | HR 68 | Temp 98.2°F | Resp 16 | Ht 62.0 in | Wt 156.2 lb

## 2016-03-19 DIAGNOSIS — Z7984 Long term (current) use of oral hypoglycemic drugs: Secondary | ICD-10-CM | POA: Diagnosis not present

## 2016-03-19 DIAGNOSIS — C8590 Non-Hodgkin lymphoma, unspecified, unspecified site: Secondary | ICD-10-CM | POA: Insufficient documentation

## 2016-03-19 DIAGNOSIS — C8331 Diffuse large B-cell lymphoma, lymph nodes of head, face, and neck: Secondary | ICD-10-CM | POA: Diagnosis not present

## 2016-03-19 DIAGNOSIS — D509 Iron deficiency anemia, unspecified: Secondary | ICD-10-CM

## 2016-03-19 DIAGNOSIS — Z79899 Other long term (current) drug therapy: Secondary | ICD-10-CM | POA: Insufficient documentation

## 2016-03-19 DIAGNOSIS — Z7982 Long term (current) use of aspirin: Secondary | ICD-10-CM | POA: Diagnosis not present

## 2016-03-19 LAB — CBC WITH DIFFERENTIAL/PLATELET
Basophils Absolute: 0 10*3/uL (ref 0.0–0.1)
Basophils Relative: 1 %
EOS PCT: 2 %
Eosinophils Absolute: 0.1 10*3/uL (ref 0.0–0.7)
HEMATOCRIT: 33.4 % — AB (ref 36.0–46.0)
Hemoglobin: 11.6 g/dL — ABNORMAL LOW (ref 12.0–15.0)
LYMPHS ABS: 1.4 10*3/uL (ref 0.7–4.0)
LYMPHS PCT: 33 %
MCH: 31.4 pg (ref 26.0–34.0)
MCHC: 34.7 g/dL (ref 30.0–36.0)
MCV: 90.3 fL (ref 78.0–100.0)
MONO ABS: 0.3 10*3/uL (ref 0.1–1.0)
Monocytes Relative: 7 %
NEUTROS ABS: 2.5 10*3/uL (ref 1.7–7.7)
Neutrophils Relative %: 57 %
PLATELETS: 164 10*3/uL (ref 150–400)
RBC: 3.7 MIL/uL — AB (ref 3.87–5.11)
RDW: 11.6 % (ref 11.5–15.5)
WBC: 4.3 10*3/uL (ref 4.0–10.5)

## 2016-03-19 LAB — COMPREHENSIVE METABOLIC PANEL
ALT: 20 U/L (ref 14–54)
AST: 17 U/L (ref 15–41)
Albumin: 3.9 g/dL (ref 3.5–5.0)
Alkaline Phosphatase: 84 U/L (ref 38–126)
Anion gap: 5 (ref 5–15)
BILIRUBIN TOTAL: 0.4 mg/dL (ref 0.3–1.2)
BUN: 18 mg/dL (ref 6–20)
CHLORIDE: 99 mmol/L — AB (ref 101–111)
CO2: 27 mmol/L (ref 22–32)
CREATININE: 0.89 mg/dL (ref 0.44–1.00)
Calcium: 9.1 mg/dL (ref 8.9–10.3)
Glucose, Bld: 156 mg/dL — ABNORMAL HIGH (ref 65–99)
Potassium: 4.9 mmol/L (ref 3.5–5.1)
Sodium: 131 mmol/L — ABNORMAL LOW (ref 135–145)
TOTAL PROTEIN: 6.4 g/dL — AB (ref 6.5–8.1)

## 2016-03-19 LAB — LACTATE DEHYDROGENASE: LDH: 153 U/L (ref 98–192)

## 2016-03-19 NOTE — Assessment & Plan Note (Signed)
She has iron deficiency. I discussed with her that her anemia although mild is slightly worse than last year. Last colonoscopy was in 2014. She is not willing to let me evaluate further today however, she is willing to release her medical records from her PCP regarding her iron deficiency. We will get these and go from there. I advised her that I am concerned about her iron deficiency and anemia, and feel further evaluation may be needed. She is agreeable to consider once her medical records are reviewed.

## 2016-03-19 NOTE — Patient Instructions (Addendum)
[place] at [facility] Discharge Instructions  RECOMMENDATIONS MADE BY THE CONSULTANT AND ANY TEST RESULTS WILL BE SENT TO YOUR REFERRING PHYSICIAN.  You saw Dr. Whitney Muse today. Chest CT soon. Follow up in one year with labs.  Thank you for choosing [place] at [facility] to provide your oncology and hematology care.  To afford each patient quality time with our provider, please arrive at least 15 minutes before your scheduled appointment time.   Beginning January 23rd 2017 lab work for the [facility] will be done in the  Main lab at [facility] on 1st floor. If you have a lab appointment with the [facility] please come in thru the  Main Entrance and check in at the main information desk  You need to re-schedule your appointment should you arrive 10 or more minutes late.  We strive to give you quality time with our providers, and arriving late affects you and other patients whose appointments are after yours.  Also, if you no show three or more times for appointments you may be dismissed from the clinic at the providers discretion.     Again, thank you for choosing [facility].  Our hope is that these requests will decrease the amount of time that you wait before being seen by our physicians.       _____________________________________________________________  Should you have questions after your visit to [facility], please contact our office at [phone] between the hours of 8:30 a.m. and 4:30 p.m.  Voicemails left after 4:30 p.m. will not be returned until the following business day.  For prescription refill requests, have your pharmacy contact our office.         Resources For Cancer Patients and their Caregivers ? American Cancer Society: Can assist with transportation, wigs, general needs, runs Look Good Feel Better.        915-595-6423 ? Cancer Care: Provides financial assistance, online  support groups, medication/co-pay assistance.  1-800-813-HOPE (534) 629-2899) ? [place] Assists [place] cancer patients and their families through emotional , educational and financial support.  765-707-6135 ? [place] DSS Where to apply for food stamps, Medicaid and utility assistance. 279-444-7787 ? RCATS: Transportation to medical appointments. (402)127-4556 ? Social Security Administration: May apply for disability if have a Stage IV cancer. 779-201-0858 [phone] ? [employer], Disability and Transit Services: Assists with nutrition, care and transit needs. [place] Support Programs: @10RELATIVEDAYS @ > Cancer Support Group  2nd Tuesday of the month 1pm-2pm, Journey Room  > Creative Journey  3rd Tuesday of the month 1130am-1pm, Journey Room  > Look Good Feel Better  1st Wednesday of the month 10am-12 noon, Journey Room (Call [place] to register [phone])

## 2016-03-19 NOTE — Assessment & Plan Note (Signed)
She currently has no obvious evidence of recurrent disease. She does have fullness to the L chest wall which concerns her, she notes discomfort in this area. She had previous XRT as part of her initial therapy. I have ordered CT of the chest and will call with results.  No B symptoms  She wishes to continue yearly follow-up. She is up to date on other well care including mammography.

## 2016-03-19 NOTE — Progress Notes (Signed)
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 female is here for yearly follow-up on her lymphoma. She is also noted to have a mild anemia. Was iron deficient last year but notes this has been followed by her PCP.   [name] [name] is unaccompanied. I personally reviewed and went over laboratory studies with the patient.  Patient states her last blood work was fine last month with her PCP.  She had to take some iron tablets for a while but they didn't do too good. They made her stomach hurt "and all that good stuff". She has not done stool cards before.   Her stool has been dark all the time since beginning Metformin. This is unchanged. She is not sure if she is passing blood in her stool since it is always dark. Her PCP follows this.   She denies any change in her bowels, "I am never constipated". The Metformin sometimes gives her diarrhea.  She feels fine and is able to do everything she wants to do. Her appetite is good.  She reports difficulty sleeping but this is an ongoing problem.  She sees her PCP every 3 to 4 months - Dr. Quincy Simmonds in [place].   She reports her clavicle becoming thicker than it was. This area was previously radiated. She questions if it will get any bigger than it already is. She denies pain but it sometimes doesn't feel quite right. She wonders if she needs imaging of this area performed.  She gets an annual mammogram. "Everything is fine". She denies any lumps or bumps anywhere. No B symptoms  MEDICAL HISTORY:  Past Medical History:  Diagnosis Date  . Diabetes mellitus   . GERD (gastroesophageal reflux disease)   . History of [facility] spotted fever   . History of shingles   . Hypertension   .  Lymphoma ([place])   . Sinusitis, chronic     SURGICAL HISTORY: Past Surgical History:  Procedure Laterality Date  . CHOLECYSTECTOMY    . COLONOSCOPY    . ESOPHAGOGASTRODUODENOSCOPY    . lump removal in throat    . PORT-A-CATH REMOVAL    . PORTACATH PLACEMENT    . REFRACTIVE SURGERY    . TONSILLECTOMY      SOCIAL HISTORY: Social History   Social History  . Marital status: Married    Spouse name: N/A  . Number of children: N/A  . Years of education: N/A   Occupational History  . Not on file.   Social History Main Topics  . Smoking status: Never Smoker  . Smokeless tobacco: Never Used  . Alcohol use No  . Drug use: No  . Sexual activity: Not on file   Other Topics Concern  . Not on file   Social History Narrative  . No narrative on file   She has 3 children, 2 girls and a boy; 2 girls and 2 boy grandchildren (young adults). She has been married 37 years.  FAMILY HISTORY: Family History  Problem Relation Age of Onset  . Hypertension Mother   . Hypertension Father   . Hypertension Sister   . Diabetes Brother   . Hypertension Brother    indicated that her mother is deceased. She indicated  that her father is deceased.    ALLERGIES:  is allergic to codeine; hctz [hydrochlorothiazide]; sudafed [pseudoephedrine hcl]; and anesthetics, amide.  MEDICATIONS:  Current Outpatient Prescriptions  Medication Sig Dispense Refill  . amLODipine (NORVASC) 2.5 MG tablet Take 2.5 mg by mouth daily.    [place] aspirin 81 MG tablet Take 81 mg by mouth daily.     . calcium carbonate (TUMS - DOSED IN MG ELEMENTAL CALCIUM) 500 MG chewable tablet Chew 1 tablet by mouth as needed.    . Calcium Carbonate-Vitamin D (CALCIUM 600+D) 600-200 MG-UNIT TABS Take by mouth daily.    . cetirizine (ZYRTEC) 10 MG tablet Take 10 mg by mouth as needed.    Marland Kitchen glimepiride (AMARYL) 4 MG tablet Take 4 mg by mouth daily before breakfast.    . losartan (COZAAR) 50 MG tablet Take 50 mg by mouth daily.    .  metFORMIN (GLUCOPHAGE) 500 MG tablet Take 500 mg by mouth 2 (two) times daily with a meal. 500mg  in am 1000mg  in pm    . nateglinide (STARLIX) 120 MG tablet Take 120 mg by mouth 1 day or 1 dose.    Marland Kitchen omeprazole (PRILOSEC) 20 MG capsule Take 20 mg by mouth daily.    . pravastatin (PRAVACHOL) 40 MG tablet Take 40 mg by mouth daily.     No current facility-administered medications for this visit.     Review of Systems  Constitutional: Negative for chills, fever, malaise/fatigue and weight loss.  HENT: Negative for congestion, hearing loss, nosebleeds, sore throat and tinnitus.        Clavicle thickening since XRT  Eyes: Negative for blurred vision, double vision, pain and discharge.  Respiratory: Negative for cough, hemoptysis, sputum production, shortness of breath and wheezing.   Cardiovascular: Negative for chest pain, palpitations, claudication, leg swelling and PND.  Gastrointestinal: Negative for abdominal pain, constipation, diarrhea, heartburn, nausea and vomiting.       Dark stool since beginning Metformin (unchanged)  Genitourinary: Negative for dysuria, frequency, hematuria and urgency.  Musculoskeletal: Negative for falls, joint pain and myalgias.  Skin: Negative for itching and rash.  Neurological: Negative for dizziness, tingling, tremors, sensory change, speech change, focal weakness, seizures, loss of consciousness, weakness and headaches.  Endo/Heme/Allergies: Does not bruise/bleed easily.  Psychiatric/Behavioral: Negative for depression, memory loss, substance abuse and suicidal ideas. The patient has insomnia. The patient is not nervous/anxious.        Chronic sleeping problems.  All other systems reviewed and are negative. 14 point ROS was done and is otherwise as detailed above or in HPI  PHYSICAL EXAMINATION: ECOG PERFORMANCE STATUS: 0 - Asymptomatic  Vitals:   03/19/16 1242  BP: (!) 158/61  Pulse: 68  Resp: 16  Temp: 98.2 F (36.8 C)   Filed Weights    03/19/16 1242  Weight: 156 lb 3.2 oz (70.9 kg)    Physical Exam  Constitutional: She is oriented to person, place, and time and well-developed, well-nourished, and in no distress.  HENT:  Head: Normocephalic and atraumatic.  Nose: Nose normal.  Mouth/Throat: Oropharynx is clear and moist. No oropharyngeal exudate.  Eyes: Conjunctivae and EOM are normal. Pupils are equal, round, and reactive to light. Right eye exhibits no discharge. Left eye exhibits no discharge. No scleral icterus.  Neck: Normal range of motion. Neck supple. No tracheal deviation present. No thyromegaly present.  Skin changes ie. Firmness noted on left side of neck from XRT   Cardiovascular: Normal rate, regular rhythm and normal heart sounds.  Exam reveals no gallop and no friction rub.   No murmur heard. Pulmonary/Chest: Effort normal and breath sounds normal. She has no wheezes. She has no rales.  L chest wall/clavicular fullness  Abdominal: Soft. Bowel sounds are normal. She exhibits no distension and no mass. There is no tenderness. There is no rebound and no guarding.  Musculoskeletal: Normal range of motion. She exhibits no edema.  Lymphadenopathy:       Head (right side): No submental, no submandibular, no preauricular and no posterior auricular adenopathy present.       Head (left side): No submental, no submandibular, no preauricular and no posterior auricular adenopathy present.    She has no cervical adenopathy.       Right cervical: No superficial cervical, no deep cervical and no posterior cervical adenopathy present.      Left cervical: No superficial cervical, no deep cervical and no posterior cervical adenopathy present.       Right axillary: No lateral adenopathy present.       Left axillary: No pectoral and no lateral adenopathy present.      Right: No supraclavicular adenopathy present.       Left: No supraclavicular adenopathy present.  Neurological: She is alert and oriented to person, place, and  time. She has normal reflexes. No cranial nerve deficit. Gait normal. Coordination normal.  Skin: Skin is warm and dry. No rash noted.  Psychiatric: Mood, memory, affect and judgment normal.  Nursing note and vitals reviewed.  LABORATORY DATA:  I have reviewed the data as listed Lab Results  Component Value Date   WBC 4.3 03/19/2016   HGB 11.6 (L) 03/19/2016   HCT 33.4 (L) 03/19/2016   MCV 90.3 03/19/2016   PLT 164 03/19/2016   CMP     Component Value Date/Time   NA 131 (L) 03/19/2016 1215   K 4.9 03/19/2016 1215   CL 99 (L) 03/19/2016 1215   CO2 27 03/19/2016 1215   GLUCOSE 156 (H) 03/19/2016 1215   BUN 18 03/19/2016 1215   CREATININE 0.89 03/19/2016 1215   CALCIUM 9.1 03/19/2016 1215   PROT 6.4 (L) 03/19/2016 1215   ALBUMIN 3.9 03/19/2016 1215   AST 17 03/19/2016 1215   ALT 20 03/19/2016 1215   ALKPHOS 84 03/19/2016 1215   BILITOT 0.4 03/19/2016 1215   GFRNONAA >60 03/19/2016 1215   GFRAA >60 03/19/2016 1215   Results for LAURY, NYDEGGER (MRN [id number])   Ref. Range 03/17/2013 08:24 03/17/2014 08:30 03/18/2015 08:59 03/18/2015 09:00  Ferritin Latest Ref Range: 11 - 307 ng/mL 64   19    ASSESSMENT & PLAN:  Stage I diffuse large B-cell lymphoma treated in 2004 No evidence of recurrence Patient up-to-date on well care including mammography and colonoscopy  Lymphoma ([place]) She currently has no obvious evidence of recurrent disease. She does have fullness to the L chest wall which concerns her, she notes discomfort in this area. She had previous XRT as part of her initial therapy. I have ordered CT of the chest and will call with results.  No B symptoms  She wishes to continue yearly follow-up. She is up to date on other well care including mammography.  Iron deficiency anemia She has iron deficiency. I discussed with her that her anemia although mild is slightly worse than last year. Last colonoscopy was in 2014. She is not willing to let me evaluate further today  however, she is willing to release her medical records from her PCP regarding her  iron deficiency. We will get these and go from there. I advised her that I am concerned about her iron deficiency and anemia, and feel further evaluation may be needed. She is agreeable to consider once her medical records are reviewed.  The patient will sign a release for PCP, Dr. Quincy Simmonds, work up and labs.   She will return for follow up in 1 year, pending iron studies from PCP.  All questions were answered. The patient knows to call the clinic with any problems, questions or concerns.  This document serves as a record of services personally performed by Ancil Linsey, MD. It was created on her behalf by Arlyce Harman, a trained medical scribe. The creation of this record is based on the scribe's personal observations and the provider's statements to them. This document has been checked and approved by the attending provider.  I have reviewed the above documentation for accuracy and completeness, and I agree with the above.  This note was electronically signed.    Molli Hazard, MD  03/19/2016 1:42 PM

## 2016-03-20 LAB — BETA 2 MICROGLOBULIN, SERUM: BETA 2 MICROGLOBULIN: 2.2 mg/L (ref 0.6–2.4)

## 2016-03-30 ENCOUNTER — Encounter (HOSPITAL_COMMUNITY): Payer: Self-pay | Admitting: Radiology

## 2016-03-30 ENCOUNTER — Ambulatory Visit (HOSPITAL_COMMUNITY)
Admission: RE | Admit: 2016-03-30 | Discharge: 2016-03-30 | Disposition: A | Discharge status: Home / Self Care | Payer: Medicare Other | Source: Ambulatory Visit | Attending: Hematology & Oncology | Admitting: Hematology & Oncology

## 2016-03-30 DIAGNOSIS — C8331 Diffuse large B-cell lymphoma, lymph nodes of head, face, and neck: Secondary | ICD-10-CM | POA: Diagnosis not present

## 2016-03-30 DIAGNOSIS — I728 Aneurysm of other specified arteries: Secondary | ICD-10-CM | POA: Diagnosis not present

## 2016-03-30 DIAGNOSIS — I700 Atherosclerosis of aorta: Secondary | ICD-10-CM | POA: Diagnosis not present

## 2016-03-30 MED ORDER — IOPAMIDOL (ISOVUE-300) INJECTION 61%
75.0000 mL | Freq: Once | INTRAVENOUS | Status: AC | PRN
  Administered 2016-03-30: 75 mL via INTRAVENOUS

## 2016-04-13 ENCOUNTER — Telehealth (HOSPITAL_COMMUNITY): Payer: Self-pay | Admitting: *Deleted

## 2016-04-13 NOTE — Telephone Encounter (Signed)
Pt notified of CT results, which included imaging of the her clavicle.  Notified of stable results.

## 2017-03-20 ENCOUNTER — Encounter (HOSPITAL_COMMUNITY): Payer: Self-pay | Admitting: Adult Health

## 2017-03-20 ENCOUNTER — Other Ambulatory Visit (HOSPITAL_COMMUNITY): Payer: Medicare Other

## 2017-03-20 ENCOUNTER — Ambulatory Visit (HOSPITAL_COMMUNITY): Payer: Medicare Other | Admitting: Adult Health

## 2017-03-20 ENCOUNTER — Encounter (HOSPITAL_COMMUNITY): Payer: Medicare Other | Attending: Oncology | Admitting: Adult Health

## 2017-03-20 ENCOUNTER — Encounter (HOSPITAL_COMMUNITY): Payer: Medicare Other

## 2017-03-20 VITALS — BP 143/59 | HR 67 | Resp 16 | Ht 62.0 in | Wt 153.0 lb

## 2017-03-20 DIAGNOSIS — C8331 Diffuse large B-cell lymphoma, lymph nodes of head, face, and neck: Secondary | ICD-10-CM

## 2017-03-20 DIAGNOSIS — Z7984 Long term (current) use of oral hypoglycemic drugs: Secondary | ICD-10-CM | POA: Diagnosis not present

## 2017-03-20 DIAGNOSIS — K219 Gastro-esophageal reflux disease without esophagitis: Secondary | ICD-10-CM | POA: Insufficient documentation

## 2017-03-20 DIAGNOSIS — Z8249 Family history of ischemic heart disease and other diseases of the circulatory system: Secondary | ICD-10-CM | POA: Diagnosis not present

## 2017-03-20 DIAGNOSIS — I10 Essential (primary) hypertension: Secondary | ICD-10-CM | POA: Diagnosis not present

## 2017-03-20 DIAGNOSIS — D649 Anemia, unspecified: Secondary | ICD-10-CM | POA: Diagnosis not present

## 2017-03-20 DIAGNOSIS — Z7982 Long term (current) use of aspirin: Secondary | ICD-10-CM | POA: Diagnosis not present

## 2017-03-20 DIAGNOSIS — Z923 Personal history of irradiation: Secondary | ICD-10-CM | POA: Insufficient documentation

## 2017-03-20 DIAGNOSIS — Z8572 Personal history of non-Hodgkin lymphomas: Secondary | ICD-10-CM | POA: Diagnosis not present

## 2017-03-20 DIAGNOSIS — Z833 Family history of diabetes mellitus: Secondary | ICD-10-CM | POA: Diagnosis not present

## 2017-03-20 DIAGNOSIS — Z9221 Personal history of antineoplastic chemotherapy: Secondary | ICD-10-CM | POA: Insufficient documentation

## 2017-03-20 DIAGNOSIS — Z9049 Acquired absence of other specified parts of digestive tract: Secondary | ICD-10-CM | POA: Insufficient documentation

## 2017-03-20 DIAGNOSIS — E119 Type 2 diabetes mellitus without complications: Secondary | ICD-10-CM | POA: Diagnosis not present

## 2017-03-20 DIAGNOSIS — Z8619 Personal history of other infectious and parasitic diseases: Secondary | ICD-10-CM | POA: Insufficient documentation

## 2017-03-20 DIAGNOSIS — Z888 Allergy status to other drugs, medicaments and biological substances status: Secondary | ICD-10-CM | POA: Insufficient documentation

## 2017-03-20 DIAGNOSIS — Z9889 Other specified postprocedural states: Secondary | ICD-10-CM | POA: Insufficient documentation

## 2017-03-20 DIAGNOSIS — Z79899 Other long term (current) drug therapy: Secondary | ICD-10-CM | POA: Diagnosis not present

## 2017-03-20 DIAGNOSIS — Z08 Encounter for follow-up examination after completed treatment for malignant neoplasm: Secondary | ICD-10-CM | POA: Insufficient documentation

## 2017-03-20 LAB — COMPREHENSIVE METABOLIC PANEL
ALK PHOS: 64 U/L (ref 38–126)
ALT: 21 U/L (ref 14–54)
AST: 24 U/L (ref 15–41)
Albumin: 4.2 g/dL (ref 3.5–5.0)
Anion gap: 7 (ref 5–15)
BUN: 22 mg/dL — AB (ref 6–20)
CALCIUM: 9.2 mg/dL (ref 8.9–10.3)
CO2: 25 mmol/L (ref 22–32)
CREATININE: 1.04 mg/dL — AB (ref 0.44–1.00)
Chloride: 93 mmol/L — ABNORMAL LOW (ref 101–111)
GFR, EST NON AFRICAN AMERICAN: 52 mL/min — AB (ref >60)
Glucose, Bld: 129 mg/dL — ABNORMAL HIGH (ref 65–99)
Potassium: 4.4 mmol/L (ref 3.5–5.1)
SODIUM: 125 mmol/L — AB (ref 135–145)
Total Bilirubin: 0.7 mg/dL (ref 0.3–1.2)
Total Protein: 6.6 g/dL (ref 6.5–8.1)

## 2017-03-20 LAB — CBC WITH DIFFERENTIAL/PLATELET
BASOS ABS: 0 10*3/uL (ref 0.0–0.1)
BASOS PCT: 1 %
Eosinophils Absolute: 0.1 10*3/uL (ref 0.0–0.7)
Eosinophils Relative: 2 %
HEMATOCRIT: 32.8 % — AB (ref 36.0–46.0)
Hemoglobin: 11.8 g/dL — ABNORMAL LOW (ref 12.0–15.0)
Lymphocytes Relative: 29 %
Lymphs Abs: 1.2 10*3/uL (ref 0.7–4.0)
MCH: 32.1 pg (ref 26.0–34.0)
MCHC: 36 g/dL (ref 30.0–36.0)
MCV: 89.1 fL (ref 78.0–100.0)
MONO ABS: 0.3 10*3/uL (ref 0.1–1.0)
Monocytes Relative: 7 %
NEUTROS ABS: 2.5 10*3/uL (ref 1.7–7.7)
Neutrophils Relative %: 61 %
Platelets: 172 10*3/uL (ref 150–400)
RBC: 3.68 MIL/uL — ABNORMAL LOW (ref 3.87–5.11)
RDW: 11.7 % (ref 11.5–15.5)
WBC: 4.2 10*3/uL (ref 4.0–10.5)

## 2017-03-20 LAB — FERRITIN: FERRITIN: 15 ng/mL (ref 11–307)

## 2017-03-20 LAB — LACTATE DEHYDROGENASE: LDH: 155 U/L (ref 98–192)

## 2017-03-20 NOTE — Patient Instructions (Signed)
[facility] at [facility] Discharge Instructions  RECOMMENDATIONS MADE BY THE CONSULTANT AND ANY TEST RESULTS WILL BE SENT TO YOUR REFERRING PHYSICIAN.  You were seen today by Gretchen Dawson NP.   Thank you for choosing [facility] at [facility] to provide your oncology and hematology care.  To afford each patient quality time with our provider, please arrive at least 15 minutes before your scheduled appointment time.    If you have a lab appointment with the Cancer Center please come in thru the  Main Entrance and check in at the main information desk  You need to re-schedule your appointment should you arrive 10 or more minutes late.  We strive to give you quality time with our providers, and arriving late affects you and other patients whose appointments are after yours.  Also, if you no show three or more times for appointments you may be dismissed from the clinic at the providers discretion.     Again, thank you for choosing [facility].  Our hope is that these requests will decrease the amount of time that you wait before being seen by our physicians.       _____________________________________________________________  Should you have questions after your visit to [facility], please contact our office at [phone] between the hours of 8:30 a.m. and 4:30 p.m.  Voicemails left after 4:30 p.m. will not be returned until the following business day.  For prescription refill requests, have your pharmacy contact our office.       Resources For Cancer Patients and their Caregivers ? American Cancer Society: Can assist with transportation, wigs, general needs, runs Look Good Feel Better.        1-888-227-6333 ? Cancer Care: Provides financial assistance, online support groups, medication/co-pay assistance.  1-800-813-HOPE (4673) ? [facility] Assists [place] cancer patients and  their families through emotional , educational and financial support.  336-427-4357 ? [place] DSS Where to apply for food stamps, Medicaid and utility assistance. 336-342-1394 ? RCATS: Transportation to medical appointments. 336-347-2287 ? Social Security Administration: May apply for disability if have a Stage IV cancer. 336-342-7796 1-800-772-1213 ? [place] Aging, Disability and Transit Services: Assists with nutrition, care and transit needs. 336-349-2343  Cancer Center Support Programs: @10RELATIVEDAYS@ > Cancer Support Group  2nd Tuesday of the month 1pm-2pm, Journey Room  > Creative Journey  3rd Tuesday of the month 1130am-1pm, Journey Room  > Look Good Feel Better  1st Wednesday of the month 10am-12 noon, Journey Room (Call [employer] to register 1-800-395-5775)    

## 2017-04-01 NOTE — Progress Notes (Signed)
[place], [place] [address]   CLINIC:  Medical Oncology/Hematology  PCP:  Earney Mallet, MD [address] [place] [address] [phone]   REASON FOR VISIT:  Follow-up for Diffuse large B-cell lymphoma   CURRENT THERAPY: Observation    HISTORY OF PRESENT ILLNESS:  (From Dr. Donald Pore last note on 03/19/16)  Stage I DLBCL, 6 cycles of R CHOP, IMRT to neck completed in 2004 She currently has no obvious evidence of recurrent disease. She does have fullness to the L chest wall which concerns her, she notes discomfort in this area. She had previous XRT as part of her initial therapy. I have ordered CT of the chest and will call with results.  No B symptoms  She wishes to continue yearly follow-up. She is up to date on other well care including mammography.    INTERVAL HISTORY:  [name] [name] 71 y.o. female returns for routine annual follow-up for history of lymphoma.    Overall, she tells me she has been feeling very well.  Appetite and energy levels both 100%.  Denies any new "lumps or bumps" to her neck, axilla, or groin. Denies any fever, recent infections, frank bleeding episodes, abnormal bruising, night sweats, or unintentional weight loss.   She feels cold "all the time."    She shares with me that she has been trying to lose weight recently. She is currently on the "keto diet" and is exercising by walking twice daily.  States that she is up-to-date with all of her health maintenance/cancer screenings.  She plans to get her flu shot later this month.  She is scheduled to see a new PCP in 05/2017; she thinks his name is Dr. Balinda Quails.    Reports concerns about splenic aneurysm that was noted on CT scan back last October. "Do I need to do anything for that or get any more scans?"    Otherwise, she is largely without complaints today.    REVIEW OF SYSTEMS:  Review of Systems  Constitutional: Negative.  Negative for chills,  diaphoresis, fatigue and fever.  HENT:  Negative.  Negative for lump/mass and nosebleeds.   Eyes: Negative.   Respiratory: Negative.  Negative for cough and shortness of breath.   Cardiovascular: Negative.  Negative for chest pain and leg swelling.  Gastrointestinal: Negative.  Negative for abdominal pain, blood in stool, constipation, diarrhea, nausea and vomiting.  Endocrine: Negative.   Genitourinary: Negative.  Negative for dysuria and hematuria.   Musculoskeletal: Negative.  Negative for arthralgias.  Skin: Negative.  Negative for rash.  Neurological: Negative.  Negative for dizziness and headaches.  Hematological: Negative.  Negative for adenopathy. Does not bruise/bleed easily.  Psychiatric/Behavioral: Negative.  Negative for depression and sleep disturbance. The patient is not nervous/anxious.      PAST MEDICAL/SURGICAL HISTORY:  Past Medical History:  Diagnosis Date  . Diabetes mellitus   . GERD (gastroesophageal reflux disease)   . History of [facility] spotted fever   . History of shingles   . Hypertension   . Lymphoma ([place])   . Sinusitis, chronic    Past Surgical History:  Procedure Laterality Date  . CHOLECYSTECTOMY    . COLONOSCOPY    . ESOPHAGOGASTRODUODENOSCOPY    . lump removal in throat    . PORT-A-CATH REMOVAL    . PORTACATH PLACEMENT    . REFRACTIVE SURGERY    . TONSILLECTOMY       SOCIAL HISTORY:  Social History   Social  History  . Marital status: Married    Spouse name: N/A  . Number of children: N/A  . Years of education: N/A   Occupational History  . Not on file.   Social History Main Topics  . Smoking status: Never Smoker  . Smokeless tobacco: Never Used  . Alcohol use No  . Drug use: No  . Sexual activity: Not on file   Other Topics Concern  . Not on file   Social History Narrative  . No narrative on file    FAMILY HISTORY:  Family History  Problem Relation Age of Onset  . Hypertension Mother   . Hypertension Father     . Hypertension Sister   . Diabetes Brother   . Hypertension Brother     CURRENT MEDICATIONS:  Outpatient Encounter Prescriptions as of 03/20/2017  Medication Sig  . amLODipine (NORVASC) 2.5 MG tablet Take 2.5 mg by mouth daily.  Marland Kitchen aspirin 81 MG tablet Take 81 mg by mouth daily.   . calcium carbonate (TUMS - DOSED IN MG ELEMENTAL CALCIUM) 500 MG chewable tablet Chew 1 tablet by mouth as needed.  . Calcium Carbonate-Vitamin D (CALCIUM 600+D) 600-200 MG-UNIT TABS Take by mouth daily.  . cetirizine (ZYRTEC) 10 MG tablet Take 10 mg by mouth as needed.  Marland Kitchen glimepiride (AMARYL) 4 MG tablet Take 4 mg by mouth daily before breakfast.  . losartan (COZAAR) 50 MG tablet Take 50 mg by mouth daily.  . metFORMIN (GLUCOPHAGE) 500 MG tablet Take 500 mg by mouth 2 (two) times daily with a meal. 500mg  in am 1000mg  in pm  . nateglinide (STARLIX) 120 MG tablet Take 120 mg by mouth 1 day or 1 dose.  Marland Kitchen omeprazole (PRILOSEC) 20 MG capsule Take 20 mg by mouth daily.  . pravastatin (PRAVACHOL) 40 MG tablet Take 40 mg by mouth daily.   No facility-administered encounter medications on file as of 03/20/2017.     ALLERGIES:  Allergies  Allergen Reactions  . Codeine Shortness Of Breath and Palpitations  . Hctz [Hydrochlorothiazide] Dermatitis  . Sudafed [Pseudoephedrine Hcl] Shortness Of Breath and Palpitations  . Anesthetics, Amide     Local anesthetic (systocaine) used for dental work - mouth stayed numb for extended period     PHYSICAL EXAM:  ECOG Performance status: 0 - Asymptomatic   Vitals:   03/20/17 1344  BP: (!) 143/59  Pulse: 67  Resp: 16  SpO2: 99%   Filed Weights   03/20/17 1344  Weight: 153 lb (69.4 kg)    Physical Exam  Constitutional: She is oriented to person, place, and time and well-developed, well-nourished, and in no distress.  HENT:  Head: Normocephalic.  Mouth/Throat: Oropharynx is clear and moist. No oropharyngeal exudate.  Mild (L) anterior neck fibrosis as expected s/p  previous radiation therapy. No palpable masses  -Also (L) supraclavicular area fibrosis s/p previous XRT.  No appreciable adenopathy.   Eyes: Pupils are equal, round, and reactive to light. Conjunctivae are normal. No scleral icterus.  Neck: Normal range of motion. Neck supple.  Cardiovascular: Normal rate, regular rhythm and normal heart sounds.   Pulmonary/Chest: Effort normal and breath sounds normal. No respiratory distress.  Abdominal: Soft. Bowel sounds are normal. There is no tenderness.  Musculoskeletal: Normal range of motion. She exhibits no edema.  Lymphadenopathy:    She has no cervical adenopathy.    She has no axillary adenopathy.       Right: No inguinal and no supraclavicular adenopathy present.  Left: No inguinal and no supraclavicular adenopathy present.  Neurological: She is alert and oriented to person, place, and time. No cranial nerve deficit. Gait normal.  Skin: Skin is warm and dry. No rash noted.  Psychiatric: Mood, memory, affect and judgment normal.  Nursing note and vitals reviewed.    LABORATORY DATA:  I have reviewed the labs as listed.  CBC    Component Value Date/Time   WBC 4.2 03/20/2017 1216   RBC 3.68 (L) 03/20/2017 1216   HGB 11.8 (L) 03/20/2017 1216   HCT 32.8 (L) 03/20/2017 1216   PLT 172 03/20/2017 1216   MCV 89.1 03/20/2017 1216   MCH 32.1 03/20/2017 1216   MCHC 36.0 03/20/2017 1216   RDW 11.7 03/20/2017 1216   LYMPHSABS 1.2 03/20/2017 1216   MONOABS 0.3 03/20/2017 1216   EOSABS 0.1 03/20/2017 1216   BASOSABS 0.0 03/20/2017 1216   CMP Latest Ref Rng & Units 03/20/2017 03/19/2016 03/18/2015  Glucose 65 - 99 mg/dL 129(H) 156(H) 124(H)  BUN 6 - 20 mg/dL 22(H) 18 19  Creatinine 0.44 - 1.00 mg/dL 1.04(H) 0.89 0.83  Sodium 135 - 145 mmol/L 125(L) 131(L) 130(L)  Potassium 3.5 - 5.1 mmol/L 4.4 4.9 4.5  Chloride 101 - 111 mmol/L 93(L) 99(L) 95(L)  CO2 22 - 32 mmol/L 25 27 28   Calcium 8.9 - 10.3 mg/dL 9.2 9.1 9.2  Total Protein 6.5 - 8.1  g/dL 6.6 6.4(L) 6.4(L)  Total Bilirubin 0.3 - 1.2 mg/dL 0.7 0.4 0.8  Alkaline Phos 38 - 126 U/L 64 84 67  AST 15 - 41 U/L 24 17 23   ALT 14 - 54 U/L 21 20 29     PENDING LABS:    DIAGNOSTIC IMAGING:  *The following radiologic images and reports have been reviewed independently and agree with below findings.  CT chest 03/30/16 CLINICAL DATA:  Diffuse large B-cell lymphoma of neck. Diagnosed and treated 14 years ago. Diabetes and hypertension. "Left upper chest prominence" .  EXAM: CT CHEST WITH CONTRAST  TECHNIQUE: Multidetector CT imaging of the chest was performed during intravenous contrast administration.  CONTRAST:  78mL ISOVUE-300 IOPAMIDOL (ISOVUE-300) INJECTION 61%  COMPARISON:  PET of 12/06/2009.  FINDINGS: Cardiovascular: Aortic atherosclerosis. Mild cardiomegaly, without pericardial effusion.  Mediastinum/Nodes: No supraclavicular adenopathy. Tiny nonspecific right-sided thyroid nodules. No mediastinal or hilar adenopathy.  Lungs/Pleura: No pleural fluid. Left lower lobe pleural-based nodule measures 5 mm on image 102/series 3 and was present back in 2011, benign.  Upper Abdomen: Cholecystectomy. Normal imaged portions of the liver, spleen, stomach, pancreas, adrenal glands, kidneys. distal splenic artery aneurysm measures 9 mm on image 128/ series 2 and is unchanged. No upper abdominal adenopathy.  Musculoskeletal: No dominant chest wall mass. No axillary adenopathy. No acute osseous abnormality. No focal osseous lesion. There is degenerative change at the left sternoclavicular joint with osteophyte formation.  IMPRESSION: 1. No acute findings or evidence of residual/recurrent lymphoma in the chest. 2.  Aortic atherosclerosis. 3. 9 mm splenic artery aneurysm, unchanged back to 2011. 4. No evidence of chest wall mass.   Electronically Signed   By: Abigail Miyamoto M.D.   On: 03/30/2016 16:44   PATHOLOGY:     ASSESSMENT & PLAN:    Diffuse large B-cell lymphoma:  -Diagnosed in 2004. Treated with R-CHOP chemotherapy and radiation to the neck.  -Dr. Whitney Muse noted some (L) supraclavicular/chest wall fullness on physical exam in 03/2016; CT chest was completed and showed no evidence of recurrent disease.   -The supraclavicular/chest wall fullness has resolved;  no palpable lymphadenopathy on physical exam today.  No reported night sweats, unintentional weight loss, adenopathy, fever, or infections.   -Return to cancer center in 1 year for follow-up with labs.   At possible risk for hypothyroidism:  -Given her previous neck radiation and reported cold insensitivity, she is at increased risk for developing hypothyroidism.  Since we only see the patient for annual follow-up visits, I recommended she discuss having thyroid lab studies checked periodically. She agreed with this plan.   Mild anemia:  -Being followed by PCP per patient.  Hgb 11.8 g/dL today.  She is largely asymptomatic and denies any frank bleeding episodes or severe fatigue. No pica/pagophagia.    Addendum:    *Ferritin 15. I have asked nursing to send copy of labs to patient's PCP. If we need to help with additional work-up or management of her mild anemia, we are happy to do so.  For now, will defer to PCP.   Concerns re: CT findings:  -Patient with reported concerns regarding documented splenic aneurysm on CT chest in 03/2016. Reviewed these results with patient. Based on radiologist's report, splenic aneursym is sub-cm and unchanged since 2011.  In the absence of pain or lab abnormalities, I advised no additional imaging for this finding.  Encouraged her to report any new or abnormal abdominal symptoms to her PCP in the future and future work-up could be done at that time.  Otherwise, would continue to monitor clinically at this time.    Health maintenance/Wellness promotion:  -Commended her efforts to engage in regular physical exercise by walking twice  daily.  She is currently on the "keto diet" and actively trying to lose weight.  Recommended continued follow-up with her PCP for health maintenance and age/gender appropriate cancer screenings. She has found a new PCP and is scheduled to meet him in 05/2017; she thinks his name is Dr. Balinda Quails.   -Plans to get flu shot at the end of this month. Encouraged her to do so.     Dispo:  -Return to cancer center in 1 year for follow-up with labs.    All questions were answered to patient's stated satisfaction. Encouraged patient to call with any new concerns or questions before her next visit to the cancer center and we can certain see her sooner, if needed.    Plan of care discussed with Dr. Talbert Cage, who agrees with the above aforementioned.    Orders placed this encounter:  Orders Placed This Encounter  Procedures  . CBC with Differential/Platelet  . Comprehensive metabolic panel  . Lactate dehydrogenase      Mike Craze, NP [place] [phone]

## 2017-07-10 IMAGING — CT CT CHEST W/ CM
2 of 3 series · 15 of 36 positions shown, 18 images · IV contrast (iopamidol)
Comparison: PET of 12/06/2009.

CLINICAL DATA: Diffuse large B-cell lymphoma of neck. Diagnosed and
treated 14 years ago. Diabetes and hypertension. "Left upper chest
prominence" .

EXAM:
CT CHEST WITH CONTRAST
TECHNIQUE: Multidetector CT imaging of the chest was performed during
intravenous contrast administration.
CONTRAST:  75mL KLAV66-2JJ IOPAMIDOL (KLAV66-2JJ) INJECTION 61%

[Series 2: axial st · axial · 0.68mm/px · z∈[+919,+1161]mm · 12 of 143 slices shown, 15 images]
[im 11/143  mediastinal]
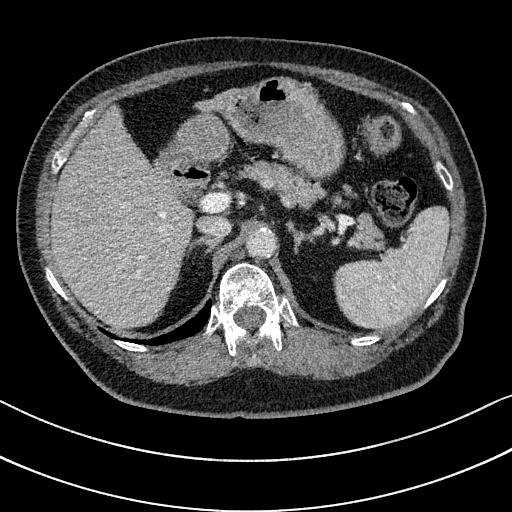
[im 11/143  lung]
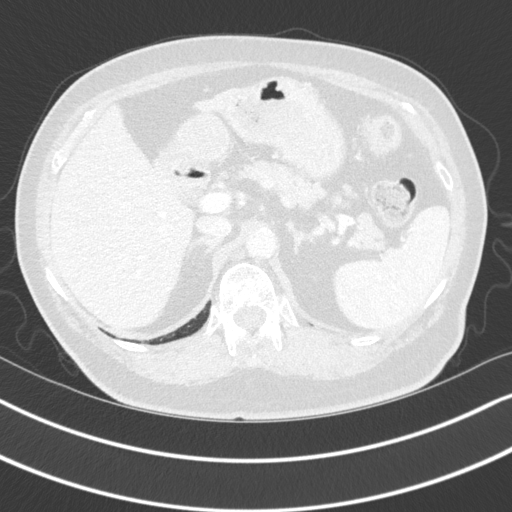
[im 22/143  lung]
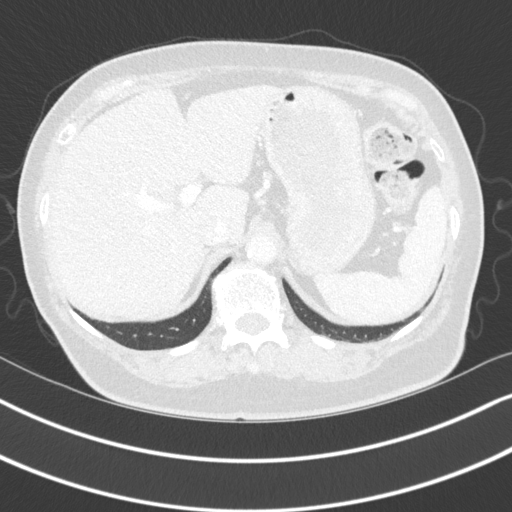
[im 32/143  lung]
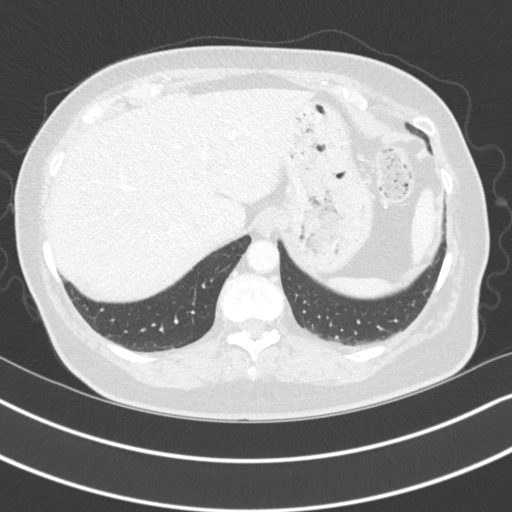
[im 43/143  lung]
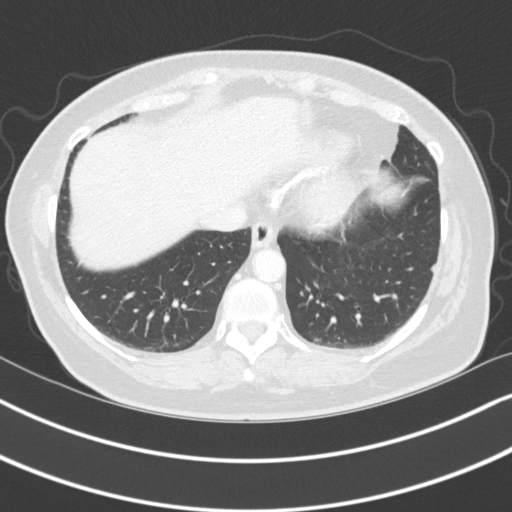
[im 53/143  mediastinal]
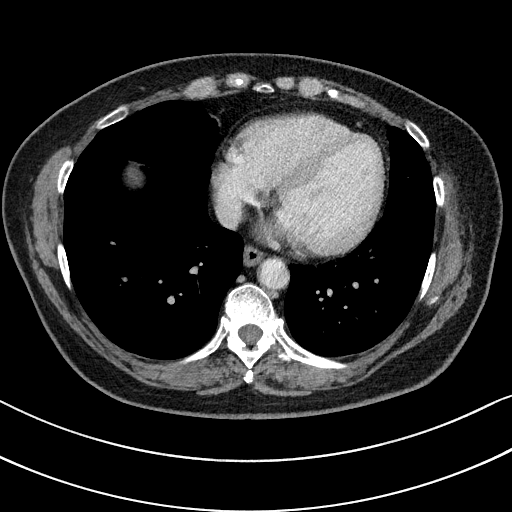
[im 53/143  lung]
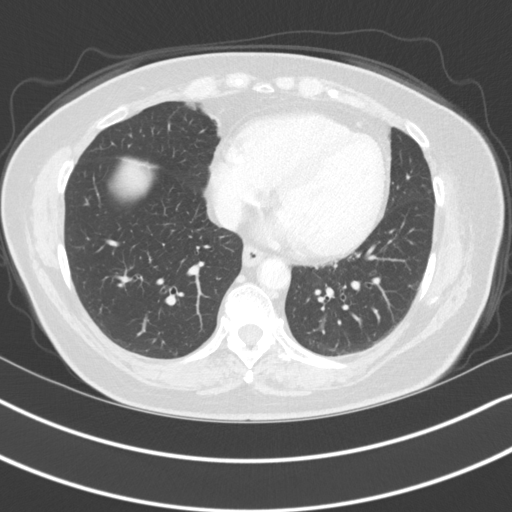
[im 64/143  lung]
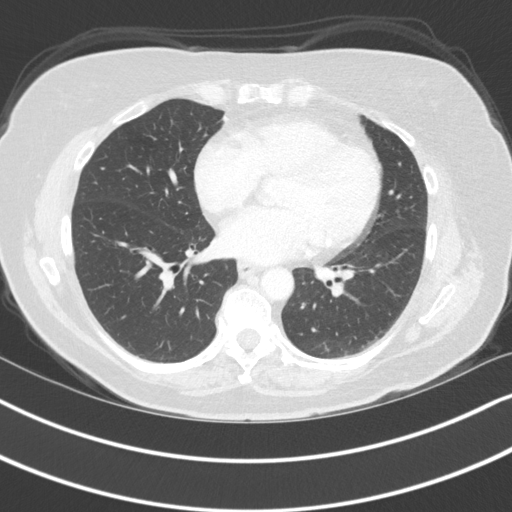
[im 79/143  lung]
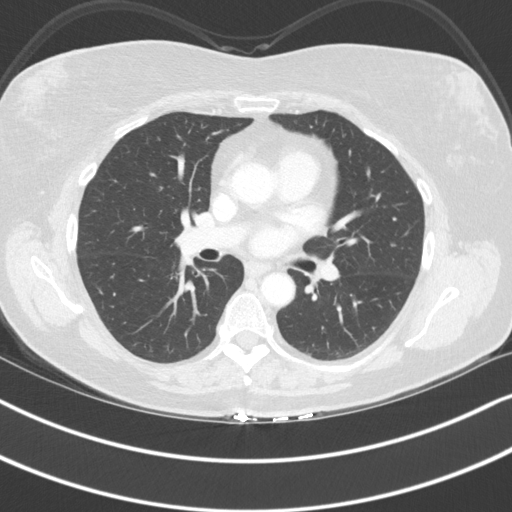
[im 90/143  lung]
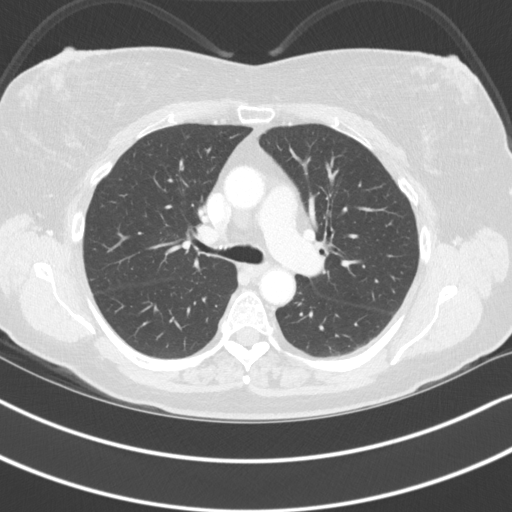
[im 100/143  mediastinal]
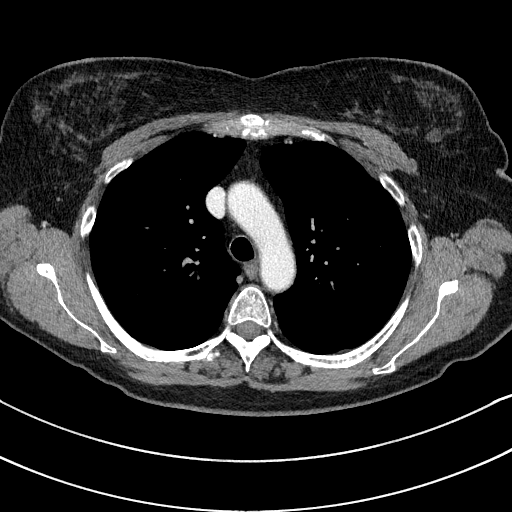
[im 100/143  lung]
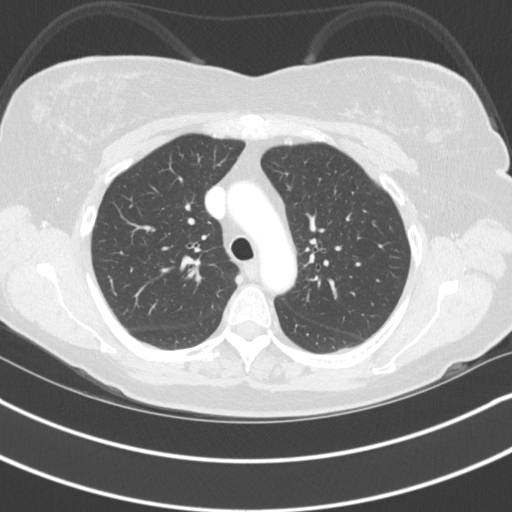
[im 111/143  lung]
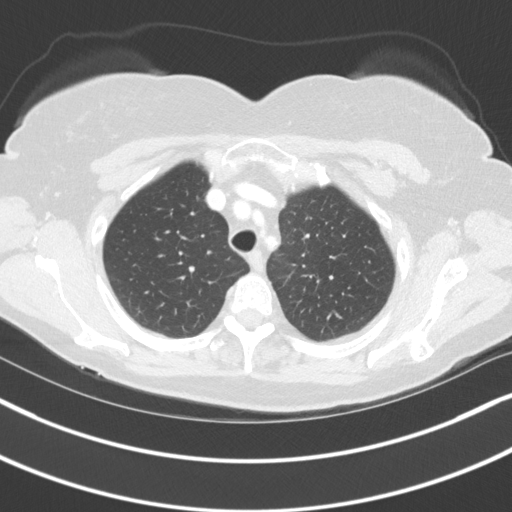
[im 121/143  lung]
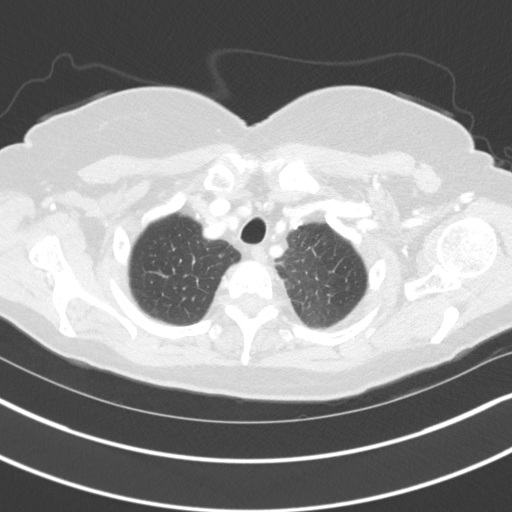
[im 132/143  lung]
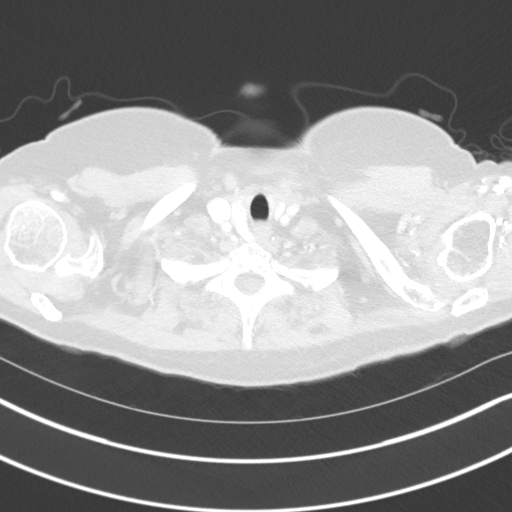

[Series 5: coronal · coronal · 0.61mm/px · 3 of 134 slices shown]
[im 27/134  lung]
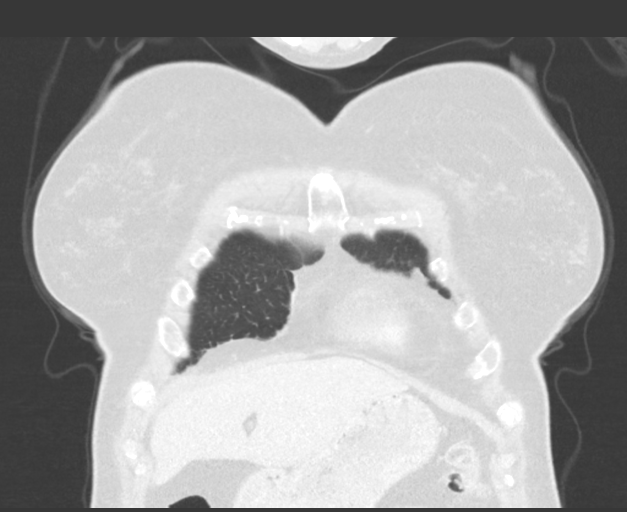
[im 54/134  lung]
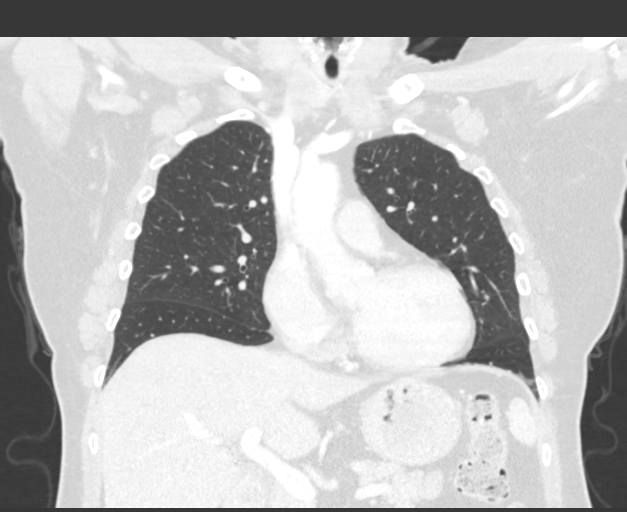
[im 80/134  lung]
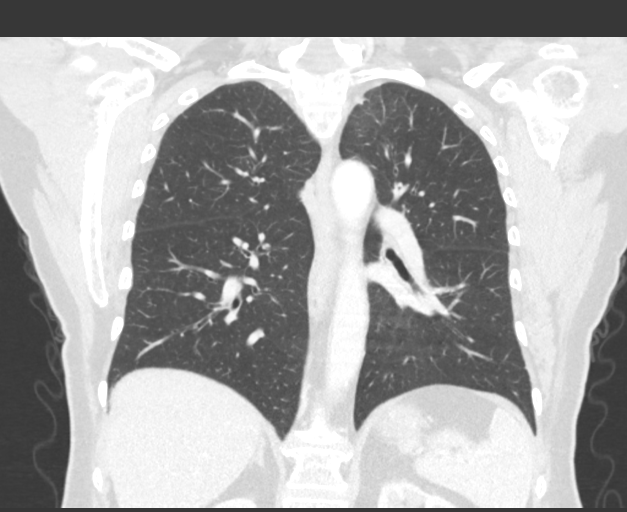

[15 of 36 positions shown; findings below may reference images not displayed]

FINDINGS: Cardiovascular: Aortic atherosclerosis. Mild cardiomegaly, without
pericardial effusion.

Mediastinum/Nodes: No supraclavicular adenopathy. Tiny nonspecific
right-sided thyroid nodules. No mediastinal or hilar adenopathy.

Lungs/Pleura: No pleural fluid. Left lower lobe pleural-based nodule
measures 5 mm on image 102/series 3 and was present back in 3155,
benign.

Upper Abdomen: Cholecystectomy. Normal imaged portions of the liver,
spleen, stomach, pancreas, adrenal glands, kidneys. distal splenic
artery aneurysm measures 9 mm on image 128/ series 2 and is
unchanged. No upper abdominal adenopathy.

Musculoskeletal: No dominant chest wall mass. No axillary
adenopathy. No acute osseous abnormality. No focal osseous lesion.
There is degenerative change at the left sternoclavicular joint with
osteophyte formation.
IMPRESSION: 1. No acute findings or evidence of residual/recurrent lymphoma in
the chest.
2.  Aortic atherosclerosis.
3. 9 mm splenic artery aneurysm, unchanged back to 3155.
4. No evidence of chest wall mass.

## 2018-03-20 ENCOUNTER — Inpatient Hospital Stay (HOSPITAL_BASED_OUTPATIENT_CLINIC_OR_DEPARTMENT_OTHER): Payer: Medicare Other | Admitting: Hematology

## 2018-03-20 ENCOUNTER — Other Ambulatory Visit (HOSPITAL_COMMUNITY): Payer: Medicare Other

## 2018-03-20 ENCOUNTER — Other Ambulatory Visit: Payer: Self-pay

## 2018-03-20 ENCOUNTER — Ambulatory Visit (HOSPITAL_COMMUNITY): Payer: Medicare Other

## 2018-03-20 ENCOUNTER — Inpatient Hospital Stay (HOSPITAL_COMMUNITY): Payer: Medicare Other | Attending: Hematology

## 2018-03-20 ENCOUNTER — Encounter (HOSPITAL_COMMUNITY): Payer: Self-pay | Admitting: Hematology

## 2018-03-20 DIAGNOSIS — Z79899 Other long term (current) drug therapy: Secondary | ICD-10-CM

## 2018-03-20 DIAGNOSIS — I10 Essential (primary) hypertension: Secondary | ICD-10-CM | POA: Insufficient documentation

## 2018-03-20 DIAGNOSIS — Z7984 Long term (current) use of oral hypoglycemic drugs: Secondary | ICD-10-CM | POA: Insufficient documentation

## 2018-03-20 DIAGNOSIS — Z923 Personal history of irradiation: Secondary | ICD-10-CM | POA: Insufficient documentation

## 2018-03-20 DIAGNOSIS — Z9221 Personal history of antineoplastic chemotherapy: Secondary | ICD-10-CM

## 2018-03-20 DIAGNOSIS — I728 Aneurysm of other specified arteries: Secondary | ICD-10-CM

## 2018-03-20 DIAGNOSIS — C8510 Unspecified B-cell lymphoma, unspecified site: Secondary | ICD-10-CM

## 2018-03-20 DIAGNOSIS — C8331 Diffuse large B-cell lymphoma, lymph nodes of head, face, and neck: Secondary | ICD-10-CM

## 2018-03-20 DIAGNOSIS — Z7982 Long term (current) use of aspirin: Secondary | ICD-10-CM | POA: Insufficient documentation

## 2018-03-20 DIAGNOSIS — E119 Type 2 diabetes mellitus without complications: Secondary | ICD-10-CM | POA: Diagnosis not present

## 2018-03-20 LAB — COMPREHENSIVE METABOLIC PANEL
ALBUMIN: 3.9 g/dL (ref 3.5–5.0)
ALT: 12 U/L (ref 0–44)
ANION GAP: 8 (ref 5–15)
AST: 16 U/L (ref 15–41)
Alkaline Phosphatase: 68 U/L (ref 38–126)
BILIRUBIN TOTAL: 0.7 mg/dL (ref 0.3–1.2)
BUN: 24 mg/dL — ABNORMAL HIGH (ref 8–23)
CALCIUM: 9.4 mg/dL (ref 8.9–10.3)
CO2: 27 mmol/L (ref 22–32)
Chloride: 100 mmol/L (ref 98–111)
Creatinine, Ser: 0.92 mg/dL (ref 0.44–1.00)
GFR calc non Af Amer: 60 mL/min — ABNORMAL LOW (ref >60)
GLUCOSE: 123 mg/dL — AB (ref 70–99)
Potassium: 4.7 mmol/L (ref 3.5–5.1)
Sodium: 135 mmol/L (ref 135–145)
TOTAL PROTEIN: 6.4 g/dL — AB (ref 6.5–8.1)

## 2018-03-20 LAB — CBC WITH DIFFERENTIAL/PLATELET
BASOS ABS: 0 10*3/uL (ref 0.0–0.1)
BASOS PCT: 1 %
Eosinophils Absolute: 0.1 10*3/uL (ref 0.0–0.7)
Eosinophils Relative: 3 %
HEMATOCRIT: 35.7 % — AB (ref 36.0–46.0)
Hemoglobin: 12.3 g/dL (ref 12.0–15.0)
LYMPHS ABS: 1 10*3/uL (ref 0.7–4.0)
Lymphocytes Relative: 29 %
MCH: 32 pg (ref 26.0–34.0)
MCHC: 34.5 g/dL (ref 30.0–36.0)
MCV: 93 fL (ref 78.0–100.0)
MONOS PCT: 11 %
Monocytes Absolute: 0.4 10*3/uL (ref 0.1–1.0)
NEUTROS ABS: 1.9 10*3/uL (ref 1.7–7.7)
NEUTROS PCT: 56 %
Platelets: 165 10*3/uL (ref 150–400)
RBC: 3.84 MIL/uL — ABNORMAL LOW (ref 3.87–5.11)
RDW: 12.1 % (ref 11.5–15.5)
WBC: 3.4 10*3/uL — ABNORMAL LOW (ref 4.0–10.5)

## 2018-03-20 LAB — LACTATE DEHYDROGENASE: LDH: 122 U/L (ref 98–192)

## 2018-03-20 NOTE — Patient Instructions (Signed)
[facility] at [facility] Discharge Instructions     Thank you for choosing [facility] at [facility] to provide your oncology and hematology care.  To afford each patient quality time with our provider, please arrive at least 15 minutes before your scheduled appointment time.   If you have a lab appointment with the Cancer Center please come in thru the  Main Entrance and check in at the main information desk  You need to re-schedule your appointment should you arrive 10 or more minutes late.  We strive to give you quality time with our providers, and arriving late affects you and other patients whose appointments are after yours.  Also, if you no show three or more times for appointments you may be dismissed from the clinic at the providers discretion.     Again, thank you for choosing [facility].  Our hope is that these requests will decrease the amount of time that you wait before being seen by our physicians.       _____________________________________________________________  Should you have questions after your visit to [facility], please contact our office at [phone] between the hours of 8:00 a.m. and 4:30 p.m.  Voicemails left after 4:00 p.m. will not be returned until the following business day.  For prescription refill requests, have your pharmacy contact our office and allow 72 hours.    Cancer Center Support Programs:   > Cancer Support Group  2nd Tuesday of the month 1pm-2pm, Journey Room    

## 2018-03-20 NOTE — Progress Notes (Signed)
[name] [name] [place], [place] [address]   CLINIC:  Medical Oncology/Hematology  PCP:  Earney Mallet, MD [address] [place] [address] [phone]   REASON FOR VISIT: Follow-up for stage IA large B-cell lymphoma  CURRENT THERAPY: observation   INTERVAL HISTORY:  [name] [name] 72 y.o. female returns for routine follow-up for stage IA large B-cell lymphoma. Patient is here today and doing well. She has no complaints at this time. Her appetite is 75% and she is maintaining her weight at this time. Her energy level is 50%. She denies any nausea, vomiting, or diarrhea. Denies any new pains. Denies any new lumps.    REVIEW OF SYSTEMS:  Review of Systems  All other systems reviewed and are negative.    PAST MEDICAL/SURGICAL HISTORY:  Past Medical History:  Diagnosis Date  . Diabetes mellitus   . GERD (gastroesophageal reflux disease)   . History of [facility] spotted fever   . History of shingles   . Hypertension   . Lymphoma ([place])   . Sinusitis, chronic    Past Surgical History:  Procedure Laterality Date  . CHOLECYSTECTOMY    . COLONOSCOPY    . ESOPHAGOGASTRODUODENOSCOPY    . lump removal in throat    . PORT-A-CATH REMOVAL    . PORTACATH PLACEMENT    . REFRACTIVE SURGERY    . TONSILLECTOMY       SOCIAL HISTORY:  Social History   Socioeconomic History  . Marital status: Married    Spouse name: Not on file  . Number of children: Not on file  . Years of education: Not on file  . Highest education level: Not on file  Occupational History  . Not on file  Social Needs  . Financial resource strain: Not on file  . Food insecurity:    Worry: Not on file    Inability: Not on file  . Transportation needs:    Medical: Not on file    Non-medical: Not on file  Tobacco Use  . Smoking status: Never Smoker  . Smokeless tobacco: Never Used  Substance and Sexual Activity  . Alcohol use: No  . Drug use: No  . Sexual activity: Not  on file  Lifestyle  . Physical activity:    Days per week: Not on file    Minutes per session: Not on file  . Stress: Not on file  Relationships  . Social connections:    Talks on phone: Not on file    Gets together: Not on file    Attends religious service: Not on file    Active member of club or organization: Not on file    Attends meetings of clubs or organizations: Not on file    Relationship status: Not on file  . Intimate partner violence:    Fear of current or ex partner: Not on file    Emotionally abused: Not on file    Physically abused: Not on file    Forced sexual activity: Not on file  Other Topics Concern  . Not on file  Social History Narrative  . Not on file    FAMILY HISTORY:  Family History  Problem Relation Age of Onset  . Hypertension Mother   . Hypertension Father   . Hypertension Sister   . Diabetes Brother   . Hypertension Brother     CURRENT MEDICATIONS:  Outpatient Encounter Medications as of 03/20/2018  Medication Sig  . ACCU-CHEK SOFTCLIX LANCETS lancets USE TO  TEST BLOOD SUGAR DAILY  . amLODipine (NORVASC) 2.5 MG tablet Take 2.5 mg by mouth daily.  Marland Kitchen aspirin 81 MG tablet Take 81 mg by mouth daily.   . calcium carbonate (TUMS - DOSED IN MG ELEMENTAL CALCIUM) 500 MG chewable tablet Chew 1 tablet by mouth as needed.  . Calcium Carbonate-Vitamin D (CALCIUM 600+D) 600-200 MG-UNIT TABS Take by mouth daily.  Marland Kitchen glimepiride (AMARYL) 2 MG tablet Take 2 mg by mouth daily before breakfast.   . losartan (COZAAR) 50 MG tablet Take 50 mg by mouth daily.  . metFORMIN (GLUCOPHAGE) 500 MG tablet Take 500 mg by mouth 2 (two) times daily with a meal.   . omeprazole (PRILOSEC) 20 MG capsule Take 20 mg by mouth daily.  . pravastatin (PRAVACHOL) 40 MG tablet Take 40 mg by mouth daily.  . [DISCONTINUED] cetirizine (ZYRTEC) 10 MG tablet Take 10 mg by mouth as needed.  . [DISCONTINUED] nateglinide (STARLIX) 120 MG tablet Take 120 mg by mouth 1 day or 1 dose.   No  facility-administered encounter medications on file as of 03/20/2018.     ALLERGIES:  Allergies  Allergen Reactions  . Codeine Shortness Of Breath and Palpitations  . Hctz [Hydrochlorothiazide] Dermatitis  . Sudafed [Pseudoephedrine Hcl] Shortness Of Breath and Palpitations  . Anesthetics, Amide     Local anesthetic (systocaine) used for dental work - mouth stayed numb for extended period     PHYSICAL EXAM:  ECOG Performance status: 1  Vitals:   03/20/18 1134  BP: (!) 144/51  Pulse: 67  Resp: 16  Temp: 97.8 F (36.6 C)  SpO2: 100%   Filed Weights   03/20/18 1134  Weight: 141 lb (64 kg)    Physical Exam  Constitutional: She is oriented to person, place, and time. She appears well-developed and well-nourished.  Cardiovascular: Normal rate, regular rhythm and normal heart sounds.  Pulmonary/Chest: Effort normal and breath sounds normal.  Musculoskeletal: Normal range of motion.  Neurological: She is alert and oriented to person, place, and time.  Skin: Skin is warm and dry.  Psychiatric: She has a normal mood and affect. Her behavior is normal. Judgment and thought content normal.     LABORATORY DATA:  I have reviewed the labs as listed.  CBC    Component Value Date/Time   WBC 3.4 (L) 03/20/2018 1038   RBC 3.84 (L) 03/20/2018 1038   HGB 12.3 03/20/2018 1038   HCT 35.7 (L) 03/20/2018 1038   PLT 165 03/20/2018 1038   MCV 93.0 03/20/2018 1038   MCH 32.0 03/20/2018 1038   MCHC 34.5 03/20/2018 1038   RDW 12.1 03/20/2018 1038   LYMPHSABS 1.0 03/20/2018 1038   MONOABS 0.4 03/20/2018 1038   EOSABS 0.1 03/20/2018 1038   BASOSABS 0.0 03/20/2018 1038   CMP Latest Ref Rng & Units 03/20/2018 03/20/2017 03/19/2016  Glucose 70 - 99 mg/dL 123(H) 129(H) 156(H)  BUN 8 - 23 mg/dL 24(H) 22(H) 18  Creatinine 0.44 - 1.00 mg/dL 0.92 1.04(H) 0.89  Sodium 135 - 145 mmol/L 135 125(L) 131(L)  Potassium 3.5 - 5.1 mmol/L 4.7 4.4 4.9  Chloride 98 - 111 mmol/L 100 93(L) 99(L)  CO2 22 -  32 mmol/L 27 25 27   Calcium 8.9 - 10.3 mg/dL 9.4 9.2 9.1  Total Protein 6.5 - 8.1 g/dL 6.4(L) 6.6 6.4(L)  Total Bilirubin 0.3 - 1.2 mg/dL 0.7 0.7 0.4  Alkaline Phos 38 - 126 U/L 68 64 84  AST 15 - 41 U/L 16 24 17   ALT  0 - 44 U/L 12 21 20        DIAGNOSTIC IMAGING:  I have reviewed the results of the CT scan of the chest from 2017..      ASSESSMENT & PLAN:   Lymphoma ([name]) 1.  Stage IA large B-cell lymphoma: - Status post 6 cycles of R-CHOP and IMRT to the left neck completed in 2004. -She denies any B symptoms including fevers, night sweats.  She is trying to lose weight.  Denies any infections or hospitalizations in the last 1 year. - She does not have any palpable adenopathy in the neck, axilla or inguinal region.  No palpable splenomegaly or other masses. -She will come back in 1 year for follow-up.  His LDH today was within normal limits. - CT scan of the chest in 2017 showed 9 mm splenic artery aneurysm which has been stable over past 7 years.  No active intervention necessary.      Orders placed this encounter:  Orders Placed This Encounter  Procedures  . Lactate dehydrogenase  . CBC with Differential/Platelet  . Comprehensive metabolic panel      Derek Jack, MD [place] [phone]

## 2018-03-20 NOTE — Assessment & Plan Note (Signed)
1.  Stage IA large B-cell lymphoma: - Status post 6 cycles of R-CHOP and IMRT to the left neck completed in 2004. -She denies any B symptoms including fevers, night sweats.  She is trying to lose weight.  Denies any infections or hospitalizations in the last 1 year. - She does not have any palpable adenopathy in the neck, axilla or inguinal region.  No palpable splenomegaly or other masses. -She will come back in 1 year for follow-up.  His LDH today was within normal limits. - CT scan of the chest in 2017 showed 9 mm splenic artery aneurysm which has been stable over past 7 years.  No active intervention necessary.

## 2019-03-20 ENCOUNTER — Inpatient Hospital Stay (HOSPITAL_COMMUNITY): Payer: Medicare Other

## 2019-03-20 ENCOUNTER — Other Ambulatory Visit: Payer: Self-pay

## 2019-03-20 ENCOUNTER — Inpatient Hospital Stay (HOSPITAL_COMMUNITY): Payer: Medicare Other | Attending: Hematology | Admitting: Hematology

## 2019-03-20 VITALS — BP 146/63 | HR 69 | Temp 97.0°F | Resp 18 | Wt 141.0 lb

## 2019-03-20 DIAGNOSIS — Z79899 Other long term (current) drug therapy: Secondary | ICD-10-CM | POA: Insufficient documentation

## 2019-03-20 DIAGNOSIS — C8331 Diffuse large B-cell lymphoma, lymph nodes of head, face, and neck: Secondary | ICD-10-CM

## 2019-03-20 DIAGNOSIS — I10 Essential (primary) hypertension: Secondary | ICD-10-CM | POA: Diagnosis not present

## 2019-03-20 DIAGNOSIS — Z8572 Personal history of non-Hodgkin lymphomas: Secondary | ICD-10-CM | POA: Diagnosis not present

## 2019-03-20 DIAGNOSIS — Z923 Personal history of irradiation: Secondary | ICD-10-CM | POA: Diagnosis not present

## 2019-03-20 DIAGNOSIS — E119 Type 2 diabetes mellitus without complications: Secondary | ICD-10-CM | POA: Insufficient documentation

## 2019-03-20 DIAGNOSIS — Z7984 Long term (current) use of oral hypoglycemic drugs: Secondary | ICD-10-CM | POA: Diagnosis not present

## 2019-03-20 DIAGNOSIS — Z9221 Personal history of antineoplastic chemotherapy: Secondary | ICD-10-CM | POA: Diagnosis not present

## 2019-03-20 DIAGNOSIS — Z0000 Encounter for general adult medical examination without abnormal findings: Secondary | ICD-10-CM | POA: Diagnosis not present

## 2019-03-20 DIAGNOSIS — Z7982 Long term (current) use of aspirin: Secondary | ICD-10-CM | POA: Diagnosis not present

## 2019-03-20 DIAGNOSIS — Z23 Encounter for immunization: Secondary | ICD-10-CM | POA: Insufficient documentation

## 2019-03-20 LAB — COMPREHENSIVE METABOLIC PANEL
ALT: 13 U/L (ref 0–44)
AST: 17 U/L (ref 15–41)
Albumin: 4.1 g/dL (ref 3.5–5.0)
Alkaline Phosphatase: 78 U/L (ref 38–126)
Anion gap: 9 (ref 5–15)
BUN: 19 mg/dL (ref 8–23)
CO2: 27 mmol/L (ref 22–32)
Calcium: 9.3 mg/dL (ref 8.9–10.3)
Chloride: 95 mmol/L — ABNORMAL LOW (ref 98–111)
Creatinine, Ser: 1.07 mg/dL — ABNORMAL HIGH (ref 0.44–1.00)
GFR calc Af Amer: 59 mL/min — ABNORMAL LOW (ref >60)
GFR calc non Af Amer: 51 mL/min — ABNORMAL LOW (ref >60)
Glucose, Bld: 131 mg/dL — ABNORMAL HIGH (ref 70–99)
Potassium: 5.1 mmol/L (ref 3.5–5.1)
Sodium: 131 mmol/L — ABNORMAL LOW (ref 135–145)
Total Bilirubin: 0.6 mg/dL (ref 0.3–1.2)
Total Protein: 6.2 g/dL — ABNORMAL LOW (ref 6.5–8.1)

## 2019-03-20 LAB — CBC WITH DIFFERENTIAL/PLATELET
Abs Immature Granulocytes: 0.01 10*3/uL (ref 0.00–0.07)
Basophils Absolute: 0 10*3/uL (ref 0.0–0.1)
Basophils Relative: 1 %
Eosinophils Absolute: 0.1 10*3/uL (ref 0.0–0.5)
Eosinophils Relative: 2 %
HCT: 35.3 % — ABNORMAL LOW (ref 36.0–46.0)
Hemoglobin: 12.2 g/dL (ref 12.0–15.0)
Immature Granulocytes: 0 %
Lymphocytes Relative: 21 %
Lymphs Abs: 0.9 10*3/uL (ref 0.7–4.0)
MCH: 32.4 pg (ref 26.0–34.0)
MCHC: 34.6 g/dL (ref 30.0–36.0)
MCV: 93.6 fL (ref 80.0–100.0)
Monocytes Absolute: 0.4 10*3/uL (ref 0.1–1.0)
Monocytes Relative: 9 %
Neutro Abs: 2.7 10*3/uL (ref 1.7–7.7)
Neutrophils Relative %: 67 %
Platelets: 179 10*3/uL (ref 150–400)
RBC: 3.77 MIL/uL — ABNORMAL LOW (ref 3.87–5.11)
RDW: 11.5 % (ref 11.5–15.5)
WBC: 4.1 10*3/uL (ref 4.0–10.5)
nRBC: 0 % (ref 0.0–0.2)

## 2019-03-20 LAB — LACTATE DEHYDROGENASE: LDH: 130 U/L (ref 98–192)

## 2019-03-20 MED ORDER — INFLUENZA VAC A&B SA ADJ QUAD 0.5 ML IM PRSY
PREFILLED_SYRINGE | INTRAMUSCULAR | Status: AC
  Filled 2019-03-20: qty 0.5

## 2019-03-20 MED ORDER — INFLUENZA VAC A&B SA ADJ QUAD 0.5 ML IM PRSY
0.5000 mL | PREFILLED_SYRINGE | Freq: Once | INTRAMUSCULAR | Status: AC
  Administered 2019-03-20: 12:00:00 0.5 mL via INTRAMUSCULAR

## 2019-03-20 NOTE — Progress Notes (Signed)
[place], [place] [address]   CLINIC:  Medical Oncology/Hematology  PCP:  Earney Mallet, MD [address] [place] [address] [phone]   REASON FOR VISIT:  Follow-up for DLBCL  CURRENT THERAPY: Clinical surveillance   INTERVAL HISTORY:  [name] [name] 73 y.o. female presents today for follow-up.  She reports overall doing well.  She denies any changes to her health history since her last visit.  She denies any fevers, chills, night sweats.  No weight loss.  Denies any lymphadenopathy.  Denies any recurrent infections.  Denies any change in bowel habits.  Denies any changes in appetite.  Overall patient states she is stable.   REVIEW OF SYSTEMS:  Review of Systems  All other systems reviewed and are negative.    PAST MEDICAL/SURGICAL HISTORY:  Past Medical History:  Diagnosis Date  . Diabetes mellitus   . GERD (gastroesophageal reflux disease)   . History of [facility] spotted fever   . History of shingles   . Hypertension   . Lymphoma ([place])   . Sinusitis, chronic    Past Surgical History:  Procedure Laterality Date  . CHOLECYSTECTOMY    . COLONOSCOPY    . ESOPHAGOGASTRODUODENOSCOPY    . lump removal in throat    . PORT-A-CATH REMOVAL    . PORTACATH PLACEMENT    . REFRACTIVE SURGERY    . TONSILLECTOMY       SOCIAL HISTORY:  Social History   Socioeconomic History  . Marital status: Married    Spouse name: Not on file  . Number of children: Not on file  . Years of education: Not on file  . Highest education level: Not on file  Occupational History  . Not on file  Social Needs  . Financial resource strain: Not on file  . Food insecurity    Worry: Not on file    Inability: Not on file  . Transportation needs    Medical: Not on file    Non-medical: Not on file  Tobacco Use  . Smoking status: Never Smoker  . Smokeless tobacco: Never Used  Substance and Sexual Activity  . Alcohol use: No  . Drug use: No   . Sexual activity: Not on file  Lifestyle  . Physical activity    Days per week: Not on file    Minutes per session: Not on file  . Stress: Not on file  Relationships  . Social Herbalist on phone: Not on file    Gets together: Not on file    Attends religious service: Not on file    Active member of club or organization: Not on file    Attends meetings of clubs or organizations: Not on file    Relationship status: Not on file  . Intimate partner violence    Fear of current or ex partner: Not on file    Emotionally abused: Not on file    Physically abused: Not on file    Forced sexual activity: Not on file  Other Topics Concern  . Not on file  Social History Narrative  . Not on file    FAMILY HISTORY:  Family History  Problem Relation Age of Onset  . Hypertension Mother   . Hypertension Father   . Hypertension Sister   . Diabetes Brother   . Hypertension Brother     CURRENT MEDICATIONS:  Outpatient Encounter Medications as of 03/20/2019  Medication Sig  . ACCU-CHEK SOFTCLIX  LANCETS lancets USE TO TEST BLOOD SUGAR DAILY  . amLODipine (NORVASC) 2.5 MG tablet Take 2.5 mg by mouth daily.  Marland Kitchen aspirin 81 MG tablet Take 81 mg by mouth daily.   . Calcium Carbonate-Vitamin D (CALCIUM 600+D) 600-200 MG-UNIT TABS Take by mouth daily.  Marland Kitchen glimepiride (AMARYL) 2 MG tablet Take 2 mg by mouth daily before breakfast.   . losartan (COZAAR) 50 MG tablet Take 50 mg by mouth daily.  . metFORMIN (GLUCOPHAGE) 500 MG tablet Take 500 mg by mouth 2 (two) times daily with a meal.   . Multiple Vitamins-Minerals (ICAPS AREDS 2 PO) Take by mouth 2 (two) times daily.  Marland Kitchen omeprazole (PRILOSEC) 20 MG capsule Take 20 mg by mouth daily.  . pravastatin (PRAVACHOL) 40 MG tablet Take 40 mg by mouth daily.  Marland Kitchen terbinafine (LAMISIL) 250 MG tablet Take 250 mg by mouth daily.  . calcium carbonate (TUMS - DOSED IN MG ELEMENTAL CALCIUM) 500 MG chewable tablet Chew 1 tablet by mouth as needed.  . White  Petrolatum-Mineral Oil (ARTIFICIAL TEARS) ointment Place into both eyes as needed.  . [EXPIRED] influenza vaccine adjuvanted (FLUAD) injection 0.5 mL    No facility-administered encounter medications on file as of 03/20/2019.     ALLERGIES:  Allergies  Allergen Reactions  . Codeine Shortness Of Breath and Palpitations  . Hctz [Hydrochlorothiazide] Dermatitis  . Sudafed [Pseudoephedrine Hcl] Shortness Of Breath and Palpitations  . Anesthetics, Amide     Local anesthetic (systocaine) used for dental work - mouth stayed numb for extended period     PHYSICAL EXAM:  ECOG Performance status: 1  Vitals:   03/20/19 1141  BP: (!) 146/63  Pulse: 69  Resp: 18  Temp: (!) 97 F (36.1 C)  SpO2: 100%   Filed Weights   03/20/19 1141  Weight: 141 lb (64 kg)    Physical Exam Constitutional:      Appearance: Normal appearance.  HENT:     Head: Normocephalic.     Right Ear: External ear normal.     Left Ear: External ear normal.     Nose: Nose normal.     Mouth/Throat:     Pharynx: Oropharynx is clear.  Eyes:     Conjunctiva/sclera: Conjunctivae normal.  Neck:     Musculoskeletal: Normal range of motion.  Cardiovascular:     Rate and Rhythm: Normal rate and regular rhythm.     Pulses: Normal pulses.     Heart sounds: Normal heart sounds.  Pulmonary:     Effort: Pulmonary effort is normal.     Breath sounds: Normal breath sounds.  Abdominal:     General: Bowel sounds are normal.  Genitourinary:    General: Normal vulva.  Musculoskeletal: Normal range of motion.  Skin:    General: Skin is warm and dry.  Neurological:     General: No focal deficit present.     Mental Status: She is alert and oriented to person, place, and time.  Psychiatric:        Mood and Affect: Mood normal.        Behavior: Behavior normal.        Thought Content: Thought content normal.        Judgment: Judgment normal.      LABORATORY DATA:  I have reviewed the labs as listed.  CBC     Component Value Date/Time   WBC 4.1 03/20/2019 1119   RBC 3.77 (L) 03/20/2019 1119   HGB 12.2 03/20/2019 1119  HCT 35.3 (L) 03/20/2019 1119   PLT 179 03/20/2019 1119   MCV 93.6 03/20/2019 1119   MCH 32.4 03/20/2019 1119   MCHC 34.6 03/20/2019 1119   RDW 11.5 03/20/2019 1119   LYMPHSABS 0.9 03/20/2019 1119   MONOABS 0.4 03/20/2019 1119   EOSABS 0.1 03/20/2019 1119   BASOSABS 0.0 03/20/2019 1119   CMP Latest Ref Rng & Units 03/20/2019 03/20/2018 03/20/2017  Glucose 70 - 99 mg/dL 131(H) 123(H) 129(H)  BUN 8 - 23 mg/dL 19 24(H) 22(H)  Creatinine 0.44 - 1.00 mg/dL 1.07(H) 0.92 1.04(H)  Sodium 135 - 145 mmol/L 131(L) 135 125(L)  Potassium 3.5 - 5.1 mmol/L 5.1 4.7 4.4  Chloride 98 - 111 mmol/L 95(L) 100 93(L)  CO2 22 - 32 mmol/L 27 27 25   Calcium 8.9 - 10.3 mg/dL 9.3 9.4 9.2  Total Protein 6.5 - 8.1 g/dL 6.2(L) 6.4(L) 6.6  Total Bilirubin 0.3 - 1.2 mg/dL 0.6 0.7 0.7  Alkaline Phos 38 - 126 U/L 78 68 64  AST 15 - 41 U/L 17 16 24   ALT 0 - 44 U/L 13 12 21         ASSESSMENT & PLAN:   Lymphoma ([facility]) 1.  Stage IA large B-cell lymphoma: - Status post 6 cycles of R-CHOP and IMRT to the left neck completed in 2004. -She denies any B symptoms including fevers, night sweats.  No weight loss.  Denies any infections or hospitalizations in the last 1 year. - She does not have any palpable adenopathy in the neck, axilla or inguinal region.  No palpable splenomegaly or other masses. 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  No active intervention necessary. - Patient has currently been in remission for the last 16 years.  Patient is requesting to continue follow-ups with primary care.  I see no clinical indication for patient to continue follow-up with oncology.  I have advised her on all signs and symptoms such as fevers, night sweats, chills, weight loss, lymphadenopathy should occur she can call the office and we will see her.   Her PCP should follow [facility].  She will be released from our office.         [place] [phone]

## 2019-03-20 NOTE — Assessment & Plan Note (Signed)
1.  Stage IA large B-cell lymphoma: - Status post 6 cycles of R-CHOP and IMRT to the left neck completed in 2004. -She denies any B symptoms including fevers, night sweats.  No weight loss.  Denies any infections or hospitalizations in the last 1 year. - She does not have any palpable adenopathy in the neck, axilla or inguinal region.  No palpable splenomegaly or other masses. 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  No active intervention necessary. - Patient has currently been in remission for the last 16 years.  Patient is requesting to continue follow-ups with primary care.  I see no clinical indication for patient to continue follow-up with oncology.  I have advised her on all signs and symptoms such as fevers, night sweats, chills, weight loss, lymphadenopathy should occur she can call the office and we will see her.  Her PCP should follow [facility].  She will be released from our office.
